# Patient Record
Sex: Female | Born: 2000
Health system: Southern US, Community
[De-identification: ages and names within clinical notes are randomized; demographics above are authoritative.]

## PROBLEM LIST (undated history)

## (undated) DIAGNOSIS — D580 Hereditary spherocytosis: Secondary | ICD-10-CM

## (undated) DIAGNOSIS — R51 Headache: Secondary | ICD-10-CM

## (undated) HISTORY — PX: ANKLE SURGERY: SHX546

## (undated) HISTORY — DX: Hereditary spherocytosis: D58.0

## (undated) HISTORY — DX: Headache: R51

---
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
## 2018-02-12 DIAGNOSIS — J0100 Acute maxillary sinusitis, unspecified: Secondary | ICD-10-CM | POA: Diagnosis not present

## 2018-02-27 DIAGNOSIS — J329 Chronic sinusitis, unspecified: Secondary | ICD-10-CM | POA: Diagnosis not present

## 2018-04-14 DIAGNOSIS — Z01419 Encounter for gynecological examination (general) (routine) without abnormal findings: Secondary | ICD-10-CM | POA: Diagnosis not present

## 2018-04-17 DIAGNOSIS — J309 Allergic rhinitis, unspecified: Secondary | ICD-10-CM | POA: Diagnosis not present

## 2018-04-17 DIAGNOSIS — Z91018 Allergy to other foods: Secondary | ICD-10-CM | POA: Diagnosis not present

## 2018-05-07 ENCOUNTER — Encounter (HOSPITAL_COMMUNITY): Payer: Self-pay | Admitting: Emergency Medicine

## 2018-05-07 ENCOUNTER — Other Ambulatory Visit: Payer: Self-pay

## 2018-05-07 ENCOUNTER — Ambulatory Visit (HOSPITAL_COMMUNITY)
Admission: EM | Admit: 2018-05-07 | Discharge: 2018-05-07 | Disposition: A | Discharge status: Home / Self Care | Payer: BLUE CROSS/BLUE SHIELD | Attending: Family Medicine | Admitting: Family Medicine

## 2018-05-07 DIAGNOSIS — H1033 Unspecified acute conjunctivitis, bilateral: Secondary | ICD-10-CM | POA: Diagnosis not present

## 2018-05-07 MED ORDER — SULFACETAMIDE SODIUM 10 % OP SOLN: 1.0000 [drp] | Freq: Four times a day (QID) | OPHTHALMIC | 0 refills | Status: DC

## 2018-05-07 NOTE — Discharge Instructions (Addendum)
Use the eye drops as scheduled Expect improvement in a few days Be careful of contagiousness.

## 2018-05-07 NOTE — ED Provider Notes (Signed)
[facility]    CSN: [id number] Arrival date & time: 05/07/18  0802     History   Chief Complaint Chief Complaint  Patient presents with  . Eye Problem    HPI Frances Bowman is a 18 y.o. female.   HPI Patient started with irritation and redness in her right swelling yesterday.  This morning it was crusted shut.  Lid is swollen.  Very irritated.  No foreign body.  No recent illness.  She did have a sore throat a couple days ago.  No exposure to pinkeye.  This morning when she woke up the left eye was slightly red as well.  No photophobia.  No tearing.  She does have scant yellow discharge. History reviewed. No pertinent past medical history.  There are no active problems to display for this patient.   Past Surgical History:  Procedure Laterality Date  . ANKLE SURGERY Left     OB History   No obstetric history on file.      Home Medications    Prior to Admission medications   Medication Sig Start Date End Date Taking? Authorizing Provider  sulfacetamide (BLEPH-10) 10 % ophthalmic solution Place 1-2 drops into both eyes 4 (four) times daily. 05/07/18   Eustace Moore, MD    Family History Family History  Problem Relation Age of Onset  . Hypertension Father     Social History Social History   Tobacco Use  . Smoking status: Never Smoker  . Smokeless tobacco: Never Used  Substance Use Topics  . Alcohol use: Yes  . Drug use: Never     Allergies   Patient has no known allergies.   Review of Systems Review of Systems  Constitutional: Negative for chills and fever.  HENT: Negative for ear pain and sore throat.   Eyes: Positive for discharge, redness and itching. Negative for photophobia, pain and visual disturbance.  Respiratory: Negative for cough and shortness of breath.   Cardiovascular: Negative for chest pain and palpitations.  Gastrointestinal: Negative for abdominal pain and vomiting.  Genitourinary: Negative for dysuria and  hematuria.  Musculoskeletal: Negative for arthralgias and back pain.  Skin: Negative for color change and rash.  Neurological: Negative for seizures and syncope.  All other systems reviewed and are negative.    Physical Exam Triage Vital Signs ED Triage Vitals  Enc Vitals Group     BP 05/07/18 0824 111/71     Pulse Rate 05/07/18 0824 71     Resp 05/07/18 0824 16     Temp 05/07/18 0824 97.7 F (36.5 C)     Temp Source 05/07/18 0824 Oral     SpO2 05/07/18 0824 100 %     Weight --      Height --      Head Circumference --      Peak Flow --      Pain Score 05/07/18 0838 2     Pain Loc --      Pain Edu? --      Excl. in GC? --    No data found.  Updated Vital Signs BP 111/71   Pulse 71   Temp 97.7 F (36.5 C) (Oral)   Resp 16   LMP 04/01/2018   SpO2 100%   Visual Acuity Right Eye Distance: 20/40 Left Eye Distance: 20/50 Bilateral Distance: 20/30(performed without glasses)  Right Eye Near:   Left Eye Near:    Bilateral Near:     Physical Exam Constitutional:  General: She is not in acute distress.    Appearance: She is well-developed.  HENT:     Head: Normocephalic and atraumatic.  Eyes:     Pupils: Pupils are equal, round, and reactive to light.     Comments: Right eye has soft tissue swelling of the upper lid.  Moderate conjunctival injection.  Yellow crusting at the medial canthus.  Left eye has moderate injection.  Neck:     Musculoskeletal: Normal range of motion.  Cardiovascular:     Rate and Rhythm: Normal rate.  Pulmonary:     Effort: Pulmonary effort is normal. No respiratory distress.  Abdominal:     General: There is no distension.     Palpations: Abdomen is soft.  Musculoskeletal: Normal range of motion.  Lymphadenopathy:     Cervical: No cervical adenopathy.  Skin:    General: Skin is warm and dry.  Neurological:     Mental Status: She is alert.      UC Treatments / Results  Labs (all labs ordered are listed, but only abnormal  results are displayed) Labs Reviewed - No data to display  EKG None  Radiology No results found.  Procedures Procedures (including critical care time)  Medications Ordered in UC Medications - No data to display  Initial Impression / Assessment and Plan / UC Course  I have reviewed the triage vital signs and the nursing notes.  Pertinent labs & imaging results that were available during my care of the patient were reviewed by me and considered in my medical decision making (see chart for details).     Discussed differential diagnosis of pinkeye, allergic, irritant, viral and bacterial.  I think this is bacterial.  We will going to treat her with an eyedrop, compresses, return if not better in a few days.  She also was advised regarding contagiousness, handwashing Final Clinical Impressions(s) / UC Diagnoses   Final diagnoses:  Acute bacterial conjunctivitis of both eyes     Discharge Instructions     Use the eye drops as scheduled Expect improvement in a few days Be careful of contagiousness.   ED Prescriptions    Medication Sig Dispense Auth. Provider   sulfacetamide (BLEPH-10) 10 % ophthalmic solution Place 1-2 drops into both eyes 4 (four) times daily. 5 mL Eustace Moore, MD     Controlled Substance Prescriptions [place] Controlled Substance Registry consulted? Not Applicable   Eustace Moore, MD 05/07/18 1023

## 2018-05-07 NOTE — ED Triage Notes (Signed)
Woke with right eyelid swelling today.  Reports right eye was red yesterday.  Left is red too.  No blurriness.  Reports drainage this morning.    Patient wears glasses-never wears contacts   2-3 days prior to this, did have a sore throat.  No sore throat now

## 2018-05-13 ENCOUNTER — Ambulatory Visit (HOSPITAL_COMMUNITY)
Admission: EM | Admit: 2018-05-13 | Discharge: 2018-05-13 | Disposition: A | Discharge status: Home / Self Care | Payer: BLUE CROSS/BLUE SHIELD

## 2018-05-13 ENCOUNTER — Encounter (HOSPITAL_COMMUNITY): Payer: Self-pay | Admitting: Emergency Medicine

## 2018-05-13 ENCOUNTER — Other Ambulatory Visit: Payer: Self-pay

## 2018-05-13 DIAGNOSIS — B349 Viral infection, unspecified: Secondary | ICD-10-CM | POA: Diagnosis not present

## 2018-05-13 DIAGNOSIS — H66002 Acute suppurative otitis media without spontaneous rupture of ear drum, left ear: Secondary | ICD-10-CM

## 2018-05-13 MED ORDER — FLUTICASONE PROPIONATE 50 MCG/ACT NA SUSP: 2.0000 | Freq: Every day | NASAL | 0 refills | Status: DC

## 2018-05-13 MED ORDER — IPRATROPIUM BROMIDE 0.06 % NA SOLN: 2.0000 | Freq: Four times a day (QID) | NASAL | 0 refills | Status: DC

## 2018-05-13 NOTE — Discharge Instructions (Signed)
Start flonase, atrovent nasal spray for nasal congestion/drainage. You can use over the counter nasal saline rinse such as neti pot for nasal congestion. Keep hydrated, your urine should be clear to pale yellow in color. Tylenol/motrin for fever and pain. Monitor for any worsening of symptoms, chest pain, shortness of breath, wheezing, swelling of the throat, follow up for reevaluation.   As discussed, can use over the counter afrin for the first 2-3 days as needed.

## 2018-05-13 NOTE — ED Triage Notes (Signed)
Seen 1/30 for pink eye.  Today patient has congestion, coughing, ears hurting, no sense of smell, no taste .

## 2018-05-13 NOTE — ED Provider Notes (Signed)
[facility]    CSN: [id number] Arrival date & time: 05/13/18  0803     History   Chief Complaint Chief Complaint  Patient presents with  . URI    HPI Frances Bowman is a 18 y.o. female.   18 year old female comes in for 5-day history of URI symptoms.  She was seen for conjunctivitis on 05/07/2018, states symptoms resolved prior to current symptom onset.  She has had congestion, cough, sneezing, bilateral ear pain, decreased sense of smell and taste.  Denies fever, chills, night sweats.  Has been using Sudafed and Zyrtec with good relief of rhinorrhea.  Never smoker.  Positive sick contact.     History reviewed. No pertinent past medical history.  There are no active problems to display for this patient.   Past Surgical History:  Procedure Laterality Date  . ANKLE SURGERY Left     OB History   No obstetric history on file.      Home Medications    Prior to Admission medications   Medication Sig Start Date End Date Taking? Authorizing Provider  cetirizine (ZYRTEC) 10 MG chewable tablet Chew 10 mg by mouth daily.   Yes [provider]  pseudoephedrine (SUDAFED) 30 MG tablet Take 30 mg by mouth every 4 (four) hours as needed for congestion.   Yes [provider]  sulfacetamide (BLEPH-10) 10 % ophthalmic solution Place 1-2 drops into both eyes 4 (four) times daily. 05/07/18  Yes Eustace MooreNelson, Yvonne Sue, MD  fluticasone [facility](FLONASE) 50 MCG/ACT nasal spray Place 2 sprays into both nostrils daily. 05/13/18   Cathie HoopsYu, Deeksha Cotrell V, PA-C  ipratropium (ATROVENT) 0.06 % nasal spray Place 2 sprays into both nostrils 4 (four) times daily. 05/13/18   Belinda FisherYu, Kobe Jansma V, PA-C    Family History Family History  Problem Relation Age of Onset  . Hypertension Father     Social History Social History   Tobacco Use  . Smoking status: Never Smoker  . Smokeless tobacco: Never Used  Substance Use Topics  . Alcohol use: Yes  . Drug use: Never     Allergies   Patient has no known  allergies.   Review of Systems Review of Systems  Reason unable to perform ROS: See HPI as above.     Physical Exam Triage Vital Signs ED Triage Vitals  Enc Vitals Group     BP 05/13/18 0829 115/77     Pulse Rate 05/13/18 0829 (!) 109     Resp --      Temp 05/13/18 0829 98.6 F (37 C)     Temp Source 05/13/18 0829 Oral     SpO2 05/13/18 0829 97 %     Weight --      Height --      Head Circumference --      Peak Flow --      Pain Score 05/13/18 0837 4     Pain Loc --      Pain Edu? --      Excl. in GC? --    No data found.  Updated Vital Signs BP 115/77 (BP Location: Left Arm)   Pulse (!) 109 Comment: Reported HR to PA GambiaBrittany Wurst and Nurse Katy Pelky  Temp 98.6 F (37 C) (Oral)   LMP 03/12/2018   SpO2 97%   Physical Exam Constitutional:      General: She is not in acute distress.    Appearance: She is well-developed. She is not ill-appearing, toxic-appearing or diaphoretic.  HENT:  Head: Normocephalic and atraumatic.     Right Ear: Tympanic membrane, ear canal and external ear normal. Tympanic membrane is not erythematous or bulging.     Left Ear: Ear canal and external ear normal. Tympanic membrane is erythematous. Tympanic membrane is not bulging.     Nose: Congestion present.     Right Sinus: No maxillary sinus tenderness or frontal sinus tenderness.     Left Sinus: No maxillary sinus tenderness or frontal sinus tenderness.     Mouth/Throat:     Pharynx: Uvula midline.  Eyes:     Conjunctiva/sclera: Conjunctivae normal.     Pupils: Pupils are equal, round, and reactive to light.  Neck:     Musculoskeletal: Normal range of motion and neck supple.  Cardiovascular:     Rate and Rhythm: Normal rate and regular rhythm.     Heart sounds: Normal heart sounds. No murmur. No friction rub. No gallop.   Pulmonary:     Effort: Pulmonary effort is normal.     Breath sounds: Normal breath sounds. No decreased breath sounds, wheezing, rhonchi or rales.    Lymphadenopathy:     Cervical: No cervical adenopathy.  Skin:    General: Skin is warm and dry.  Neurological:     Mental Status: She is alert and oriented to person, place, and time.  Psychiatric:        Behavior: Behavior normal.        Judgment: Judgment normal.      UC Treatments / Results  Labs (all labs ordered are listed, but only abnormal results are displayed) Labs Reviewed - No data to display  EKG None  Radiology No results found.  Procedures Procedures (including critical care time)  Medications Ordered in UC Medications - No data to display  Initial Impression / Assessment and Plan / UC Course  I have reviewed the triage vital signs and the nursing notes.  Pertinent labs & imaging results that were available during my care of the patient were reviewed by me and considered in my medical decision making (see chart for details).    Patient with left otitis media, discussed symptomatic treatment versus antibiotics.  Patient would like to avoid antibiotics at this time.  Symptomatic treatment discussed.  Push fluids.  Return precautions given.  Patient expresses understanding and agrees plan.  Final Clinical Impressions(s) / UC Diagnoses   Final diagnoses:  Viral illness  Non-recurrent acute suppurative otitis media of left ear without spontaneous rupture of tympanic membrane    ED Prescriptions    Medication Sig Dispense Auth. Provider   fluticasone (FLONASE) 50 MCG/ACT nasal spray Place 2 sprays into both nostrils daily. 1 g Renata Gambino V, PA-C   ipratropium (ATROVENT) 0.06 % nasal spray Place 2 sprays into both nostrils 4 (four) times daily. 15 mL Threasa Alpha, [place] 05/13/18 [phone]

## 2018-05-15 DIAGNOSIS — Z3202 Encounter for pregnancy test, result negative: Secondary | ICD-10-CM | POA: Diagnosis not present

## 2018-05-15 DIAGNOSIS — Z30430 Encounter for insertion of intrauterine contraceptive device: Secondary | ICD-10-CM | POA: Diagnosis not present

## 2018-06-15 DIAGNOSIS — R5383 Other fatigue: Secondary | ICD-10-CM | POA: Diagnosis not present

## 2018-06-15 DIAGNOSIS — D580 Hereditary spherocytosis: Secondary | ICD-10-CM | POA: Diagnosis not present

## 2018-06-15 DIAGNOSIS — N281 Cyst of kidney, acquired: Secondary | ICD-10-CM | POA: Diagnosis not present

## 2018-06-26 DIAGNOSIS — Z30431 Encounter for routine checking of intrauterine contraceptive device: Secondary | ICD-10-CM | POA: Diagnosis not present

## 2018-12-16 DIAGNOSIS — M9905 Segmental and somatic dysfunction of pelvic region: Secondary | ICD-10-CM | POA: Diagnosis not present

## 2018-12-16 DIAGNOSIS — M791 Myalgia, unspecified site: Secondary | ICD-10-CM | POA: Diagnosis not present

## 2018-12-16 DIAGNOSIS — M9902 Segmental and somatic dysfunction of thoracic region: Secondary | ICD-10-CM | POA: Diagnosis not present

## 2018-12-16 DIAGNOSIS — M9903 Segmental and somatic dysfunction of lumbar region: Secondary | ICD-10-CM | POA: Diagnosis not present

## 2018-12-21 DIAGNOSIS — M9905 Segmental and somatic dysfunction of pelvic region: Secondary | ICD-10-CM | POA: Diagnosis not present

## 2018-12-21 DIAGNOSIS — M791 Myalgia, unspecified site: Secondary | ICD-10-CM | POA: Diagnosis not present

## 2018-12-21 DIAGNOSIS — M9902 Segmental and somatic dysfunction of thoracic region: Secondary | ICD-10-CM | POA: Diagnosis not present

## 2018-12-21 DIAGNOSIS — M9903 Segmental and somatic dysfunction of lumbar region: Secondary | ICD-10-CM | POA: Diagnosis not present

## 2018-12-23 DIAGNOSIS — M9903 Segmental and somatic dysfunction of lumbar region: Secondary | ICD-10-CM | POA: Diagnosis not present

## 2018-12-23 DIAGNOSIS — M9902 Segmental and somatic dysfunction of thoracic region: Secondary | ICD-10-CM | POA: Diagnosis not present

## 2018-12-23 DIAGNOSIS — M9905 Segmental and somatic dysfunction of pelvic region: Secondary | ICD-10-CM | POA: Diagnosis not present

## 2018-12-23 DIAGNOSIS — M791 Myalgia, unspecified site: Secondary | ICD-10-CM | POA: Diagnosis not present

## 2018-12-28 DIAGNOSIS — M9902 Segmental and somatic dysfunction of thoracic region: Secondary | ICD-10-CM | POA: Diagnosis not present

## 2018-12-28 DIAGNOSIS — M791 Myalgia, unspecified site: Secondary | ICD-10-CM | POA: Diagnosis not present

## 2018-12-28 DIAGNOSIS — M9903 Segmental and somatic dysfunction of lumbar region: Secondary | ICD-10-CM | POA: Diagnosis not present

## 2018-12-28 DIAGNOSIS — M9905 Segmental and somatic dysfunction of pelvic region: Secondary | ICD-10-CM | POA: Diagnosis not present

## 2018-12-30 DIAGNOSIS — M9902 Segmental and somatic dysfunction of thoracic region: Secondary | ICD-10-CM | POA: Diagnosis not present

## 2018-12-30 DIAGNOSIS — M9903 Segmental and somatic dysfunction of lumbar region: Secondary | ICD-10-CM | POA: Diagnosis not present

## 2018-12-30 DIAGNOSIS — M9905 Segmental and somatic dysfunction of pelvic region: Secondary | ICD-10-CM | POA: Diagnosis not present

## 2018-12-30 DIAGNOSIS — M791 Myalgia, unspecified site: Secondary | ICD-10-CM | POA: Diagnosis not present

## 2019-01-04 DIAGNOSIS — M9903 Segmental and somatic dysfunction of lumbar region: Secondary | ICD-10-CM | POA: Diagnosis not present

## 2019-01-04 DIAGNOSIS — M9901 Segmental and somatic dysfunction of cervical region: Secondary | ICD-10-CM | POA: Diagnosis not present

## 2019-01-04 DIAGNOSIS — M9902 Segmental and somatic dysfunction of thoracic region: Secondary | ICD-10-CM | POA: Diagnosis not present

## 2019-01-11 ENCOUNTER — Other Ambulatory Visit: Payer: Self-pay

## 2019-01-11 ENCOUNTER — Encounter: Payer: Self-pay | Admitting: Podiatry

## 2019-01-11 ENCOUNTER — Ambulatory Visit (INDEPENDENT_AMBULATORY_CARE_PROVIDER_SITE_OTHER): Payer: BC Managed Care – PPO

## 2019-01-11 ENCOUNTER — Ambulatory Visit (INDEPENDENT_AMBULATORY_CARE_PROVIDER_SITE_OTHER): Payer: BC Managed Care – PPO | Admitting: Podiatry

## 2019-01-11 DIAGNOSIS — M25572 Pain in left ankle and joints of left foot: Secondary | ICD-10-CM

## 2019-01-11 DIAGNOSIS — M899 Disorder of bone, unspecified: Secondary | ICD-10-CM | POA: Diagnosis not present

## 2019-01-11 DIAGNOSIS — G8929 Other chronic pain: Secondary | ICD-10-CM | POA: Diagnosis not present

## 2019-01-11 DIAGNOSIS — M949 Disorder of cartilage, unspecified: Secondary | ICD-10-CM

## 2019-01-11 NOTE — Patient Instructions (Signed)
Pre-Operative Instructions  Congratulations, you have decided to take an important step towards improving your quality of life.  You can be assured that the doctors and staff at [facility] will be with you every step of the way.  Here are some important things you should know:  1. Plan to be at the surgery center/hospital at least 1 (one) hour prior to your scheduled time, unless otherwise directed by the surgical center/hospital staff.  You must have a responsible adult accompany you, remain during the surgery and drive you home.  Make sure you have directions to the surgical center/hospital to ensure you arrive on time. 2. If you are having surgery at [facility] or [facility] hospitals, you will need a copy of your medical history and physical form from your family physician within one month prior to the date of surgery. We will give you a form for your primary physician to complete.  3. We make every effort to accommodate the date you request for surgery.  However, there are times where surgery dates or times have to be moved.  We will contact you as soon as possible if a change in schedule is required.   4. No aspirin/ibuprofen for one week before surgery.  If you are on aspirin, any non-steroidal anti-inflammatory medications (Mobic, Aleve, Ibuprofen) should not be taken seven (7) days prior to your surgery.  You make take Tylenol for pain prior to surgery.  5. Medications - If you are taking daily heart and blood pressure medications, seizure, reflux, allergy, asthma, anxiety, pain or diabetes medications, make sure you notify the surgery center/hospital before the day of surgery so they can tell you which medications you should take or avoid the day of surgery. 6. No food or drink after midnight the night before surgery unless directed otherwise by surgical center/hospital staff. 7. No alcoholic beverages 24-hours prior to surgery.  No smoking 24-hours prior or 24-hours after  surgery. 8. Wear loose pants or shorts. They should be loose enough to fit over bandages, boots, and casts. 9. Don't wear slip-on shoes. Sneakers are preferred. 10. Bring your boot with you to the surgery center/hospital.  Also bring crutches or a walker if your physician has prescribed it for you.  If you do not have this equipment, it will be provided for you after surgery. 11. If you have not been contacted by the surgery center/hospital by the day before your surgery, call to confirm the date and time of your surgery. 12. Leave-time from work may vary depending on the type of surgery you have.  Appropriate arrangements should be made prior to surgery with your employer. 13. Prescriptions will be provided immediately following surgery by your doctor.  Fill these as soon as possible after surgery and take the medication as directed. Pain medications will not be refilled on weekends and must be approved by the doctor. 14. Remove nail polish on the operative foot and avoid getting pedicures prior to surgery. 15. Wash the night before surgery.  The night before surgery wash the foot and leg well with water and the antibacterial soap provided. Be sure to pay special attention to beneath the toenails and in between the toes.  Wash for at least three (3) minutes. Rinse thoroughly with water and dry well with a towel.  Perform this wash unless told not to do so by your physician.  Enclosed: 1 Ice pack (please put in freezer the night before surgery)   1 Hibiclens skin cleaner     Pre-op instructions  If you have any questions regarding the instructions, please do not hesitate to call our office.  [place]: [address], [place], [place] [address] -- [phone]  [place]: [address] [place], [place] [address] -- [phone]  [place]: [address]  [place], [place] [address] -- [phone]   Website: [email or web address] 

## 2019-01-13 DIAGNOSIS — M9901 Segmental and somatic dysfunction of cervical region: Secondary | ICD-10-CM | POA: Diagnosis not present

## 2019-01-13 DIAGNOSIS — M9902 Segmental and somatic dysfunction of thoracic region: Secondary | ICD-10-CM | POA: Diagnosis not present

## 2019-01-13 DIAGNOSIS — M9903 Segmental and somatic dysfunction of lumbar region: Secondary | ICD-10-CM | POA: Diagnosis not present

## 2019-01-20 DIAGNOSIS — M9901 Segmental and somatic dysfunction of cervical region: Secondary | ICD-10-CM | POA: Diagnosis not present

## 2019-01-20 DIAGNOSIS — M9902 Segmental and somatic dysfunction of thoracic region: Secondary | ICD-10-CM | POA: Diagnosis not present

## 2019-01-20 DIAGNOSIS — M9903 Segmental and somatic dysfunction of lumbar region: Secondary | ICD-10-CM | POA: Diagnosis not present

## 2019-01-22 ENCOUNTER — Telehealth: Payer: Self-pay | Admitting: *Deleted

## 2019-01-22 NOTE — Telephone Encounter (Signed)
DOS 01/27/2019 REPAIR OSTEOCHONDRAL LESION ANKLE - 28102, TIBIAL OSTEOTOMY - 41660  BCBS: Eligibility Date - 04/08/2018 - 04/07/9998   In-Network    Max Per Benefit Period Year-to-Date Remaining  CoInsurance     Deductible  $2,800.00 $6,301.60  Out-Of-Pocket 3  $3,000.00 $1,093.23   In-Network  [employer] Coinsurance Authorization Required  Not Applicable  55%  NO - per Jenny Reichmann ref. # [id number]  Messages:

## 2019-01-26 NOTE — Progress Notes (Addendum)
Subjective: 18 year old female presents the office today for follow-up evaluation of continued pain to the left ankle and she said the pain has been getting worse.  She wants to go ahead and proceed with further surgery in order to help with her pain.  She states that she not able to do her daily activities because of discomfort to her ankle.  She states after initial arthroscopy she did get some short-term relief of the pain is worsened. Denies any systemic complaints such as fevers, chills, nausea, vomiting. No acute changes since last appointment, and no other complaints at this time.   Objective: AAO x3, NAD DP/PT pulses palpable bilaterally, CRT less than 3 seconds There is tenderness palpation on the anterior aspect of the left ankle joint.  There is no crepitation with ankle joint range of motion.  No popping or clicking identified today.  No tenderness on the course of the flexor, extensor tendons.  No pain Achilles tendon.  No significant edema or erythema.  No pain with calf compression, swelling, warmth, erythema  Assessment: Osteochondral lesion left ankle  Plan: -All treatment options discussed with the patient including all alternatives, risks, complications.  -I reviewed the MRI with her as well as x-rays.  We discussed both conservative as well as surgical treatment options.  At this time she wished to proceed with surgical intervention.  We discussed open repair of the osteochondral lesion with possible medial malleolus osteotomy in order to access the surgical site.  We discussed repair with likely Denovo cartilage as well as screw fixation for the medial malleolus. -The incision placement as well as the postoperative course was discussed with the patient. I discussed risks of the surgery which include, but not limited to, infection, bleeding, pain, swelling, need for further surgery, delayed or nonhealing, painful or ugly scar, numbness or sensation changes, over/under correction,  recurrence, transfer lesions, further deformity, hardware failure, DVT/PE, loss of toe/foot. Patient understands these risks and wishes to proceed with surgery. The surgical consent was reviewed with the patient all 3 pages were signed. No promises or guarantees were given to the outcome of the procedure. All questions were answered to the best of my ability. Before the surgery the patient was encouraged to call the office if there is any further questions. The surgery will be performed at the [facility] on an outpatient basis. -CAM boot dispensed for postop use.   Trula Slade DPM  *patient requested a knee scooter for postop use. Expected duration of use is 3 months and this is due to NWB s/p open OCD repair of the talus and medial malleolus osteotomy.

## 2019-01-27 ENCOUNTER — Encounter: Payer: Self-pay | Admitting: Podiatry

## 2019-01-27 ENCOUNTER — Telehealth: Payer: Self-pay | Admitting: *Deleted

## 2019-01-27 ENCOUNTER — Other Ambulatory Visit: Payer: Self-pay | Admitting: Podiatry

## 2019-01-27 DIAGNOSIS — Z9889 Other specified postprocedural states: Secondary | ICD-10-CM

## 2019-01-27 DIAGNOSIS — M25572 Pain in left ankle and joints of left foot: Secondary | ICD-10-CM | POA: Diagnosis not present

## 2019-01-27 DIAGNOSIS — M899 Disorder of bone, unspecified: Secondary | ICD-10-CM

## 2019-01-27 DIAGNOSIS — M958 Other specified acquired deformities of musculoskeletal system: Secondary | ICD-10-CM | POA: Diagnosis not present

## 2019-01-27 DIAGNOSIS — Z01818 Encounter for other preprocedural examination: Secondary | ICD-10-CM | POA: Diagnosis not present

## 2019-01-27 DIAGNOSIS — M93279 Osteochondritis dissecans, unspecified ankle and joints of foot: Secondary | ICD-10-CM

## 2019-01-27 DIAGNOSIS — M948X7 Other specified disorders of cartilage, ankle and foot: Secondary | ICD-10-CM | POA: Diagnosis not present

## 2019-01-27 DIAGNOSIS — M93272 Osteochondritis dissecans, left ankle and joints of left foot: Secondary | ICD-10-CM | POA: Diagnosis not present

## 2019-01-27 MED ORDER — OXYCODONE-ACETAMINOPHEN 5-325 MG PO TABS: 1.0000 | ORAL_TABLET | Freq: Four times a day (QID) | ORAL | 0 refills | Status: DC | PRN

## 2019-01-27 MED ORDER — PROMETHAZINE HCL 25 MG PO TABS: 25.0000 mg | ORAL_TABLET | Freq: Three times a day (TID) | ORAL | 0 refills | Status: DC | PRN

## 2019-01-27 MED ORDER — CEPHALEXIN 500 MG PO CAPS: 500.0000 mg | ORAL_CAPSULE | Freq: Three times a day (TID) | ORAL | 0 refills | Status: DC

## 2019-01-27 NOTE — Telephone Encounter (Signed)
Faxed and emailed order to [place].

## 2019-01-27 NOTE — Progress Notes (Signed)
Postop medications sent to pharmacy  Reviewed the surgery and postop course with the patient and her father. No further questions. Consent signed. Will plan to proceed as scheduled.   Will order a knee scooter.   Trula Slade

## 2019-01-27 NOTE — Telephone Encounter (Signed)
-----   Message from Trula Slade, DPM sent at 01/27/2019  7:16 AM EDT ----- Patient requested a knee scooter this morning. Can you please order one? Thanks.

## 2019-01-29 ENCOUNTER — Telehealth: Payer: Self-pay | Admitting: *Deleted

## 2019-01-29 NOTE — Telephone Encounter (Signed)
Called and talked to the patient and I stated that I was calling to check on patient after having surgery on Wednesday January 27, 2019 with Dr Jacqualyn Posey and patient stated that she was better today and there was no fever or chills and not any nausea and the numbness has wore off and there is some swelling and I have elevated and iced and I stated to call the [place] office if any concerns or questions. Lattie Haw

## 2019-02-01 DIAGNOSIS — L819 Disorder of pigmentation, unspecified: Secondary | ICD-10-CM | POA: Diagnosis not present

## 2019-02-01 DIAGNOSIS — L700 Acne vulgaris: Secondary | ICD-10-CM | POA: Diagnosis not present

## 2019-02-01 DIAGNOSIS — D225 Melanocytic nevi of trunk: Secondary | ICD-10-CM | POA: Diagnosis not present

## 2019-02-04 ENCOUNTER — Encounter: Payer: BC Managed Care – PPO | Admitting: Podiatry

## 2019-02-05 ENCOUNTER — Ambulatory Visit (INDEPENDENT_AMBULATORY_CARE_PROVIDER_SITE_OTHER): Payer: BC Managed Care – PPO | Admitting: Podiatry

## 2019-02-05 ENCOUNTER — Ambulatory Visit (INDEPENDENT_AMBULATORY_CARE_PROVIDER_SITE_OTHER): Payer: BC Managed Care – PPO

## 2019-02-05 ENCOUNTER — Other Ambulatory Visit: Payer: Self-pay | Admitting: Podiatry

## 2019-02-05 ENCOUNTER — Other Ambulatory Visit: Payer: Self-pay

## 2019-02-05 DIAGNOSIS — M949 Disorder of cartilage, unspecified: Secondary | ICD-10-CM | POA: Diagnosis not present

## 2019-02-05 DIAGNOSIS — M899 Disorder of bone, unspecified: Secondary | ICD-10-CM

## 2019-02-05 DIAGNOSIS — M25572 Pain in left ankle and joints of left foot: Secondary | ICD-10-CM

## 2019-02-05 DIAGNOSIS — Z9889 Other specified postprocedural states: Secondary | ICD-10-CM

## 2019-02-05 DIAGNOSIS — G8929 Other chronic pain: Secondary | ICD-10-CM

## 2019-02-05 NOTE — Progress Notes (Signed)
Subjective: Frances Bowman is a 18 y.o. is seen today in office s/p left ankle osteochondral repair of the talus utilizing Denovo cartilage of medial malleolus osteotomy preformed on 01/27/2019.  She states her pain is controlled and she states is not as bad as what she thought it was going to be.  She been nonweightbearing using cam boot.  Denies any systemic complaints such as fevers, chills, nausea, vomiting. No calf pain, chest pain, shortness of breath.   Objective: General: No acute distress, AAOx3  DP/PT pulses palpable 2/4, CRT < 3 sec to all digits.  Protective sensation intact. Motor function intact.  LEFT ankle: Incision is well coapted without any evidence of dehiscence and sutures, staples and. There is no surrounding erythema, ascending cellulitis, fluctuance, crepitus, malodor, drainage/purulence. There is mild edema around the surgical site. There is minimal pain along the surgical site.  No other areas of tenderness to bilateral lower extremities.  No other open lesions or pre-ulcerative lesions.  No pain with calf compression, swelling, warmth, erythema.   Assessment and Plan:  Status post left ankle surgery, doing well with no complications   -Treatment options discussed including all alternatives, risks, and complications -X-rays obtained reviewed.  Status post medial malleolus osteotomy with hardware intact. -Antibiotic ointment was applied to the incision followed by dry sterile dressing.  Keep dressing clean, dry, intact -Continue nonweightbearing. -Ice/elevation -Pain medication as needed. -Monitor for any clinical signs or symptoms of infection and DVT/PE and directed to call the office immediately should any occur or go to the ER. -Follow-up as scheduled or sooner if any problems arise. In the meantime, encouraged to call the office with any questions, concerns, change in symptoms.   Celesta Gentile, DPM

## 2019-02-06 ENCOUNTER — Other Ambulatory Visit: Payer: Self-pay | Admitting: Podiatry

## 2019-02-06 DIAGNOSIS — M949 Disorder of cartilage, unspecified: Secondary | ICD-10-CM

## 2019-02-06 DIAGNOSIS — M899 Disorder of bone, unspecified: Secondary | ICD-10-CM

## 2019-02-11 ENCOUNTER — Ambulatory Visit (INDEPENDENT_AMBULATORY_CARE_PROVIDER_SITE_OTHER): Payer: Self-pay | Admitting: Podiatry

## 2019-02-11 ENCOUNTER — Other Ambulatory Visit: Payer: Self-pay

## 2019-02-11 DIAGNOSIS — M949 Disorder of cartilage, unspecified: Secondary | ICD-10-CM

## 2019-02-11 DIAGNOSIS — Z9889 Other specified postprocedural states: Secondary | ICD-10-CM

## 2019-02-11 DIAGNOSIS — M899 Disorder of bone, unspecified: Secondary | ICD-10-CM

## 2019-02-17 NOTE — Progress Notes (Signed)
Subjective: Frances Bowman is a 18 y.o. is seen today in office s/p left ankle osteochondral repair of the talus utilizing Denovo cartilage of medial malleolus osteotomy preformed on 01/27/2019.  She states that she is doing well she is not having any pain.  She is been nonweightbearing wearing the cam boot. Denies any systemic complaints such as fevers, chills, nausea, vomiting. No calf pain, chest pain, shortness of breath.   Objective: General: No acute distress, AAOx3  DP/PT pulses palpable 2/4, CRT < 3 sec to all digits.  Protective sensation intact. Motor function intact.  LEFT ankle: Incision is well coapted without any evidence of dehiscence and sutures, staples and. There is no surrounding erythema, ascending cellulitis, fluctuance, crepitus, malodor, drainage/purulence. There is minimal edema around the surgical site. There is no pain along the surgical site.  No other areas of tenderness to bilateral lower extremities.  No other open lesions or pre-ulcerative lesions.  No pain with calf compression, swelling, warmth, erythema.   Assessment and Plan:  Status post left ankle surgery, doing well with no complications   -Treatment options discussed including all alternatives, risks, and complications -Today with the sutures that the staples intact.  Antibiotic ointment and a bandage was applied.  Keep dressing clean, dry, intact. -Continue nonweightbearing.  Wear cam boot at all times -Ice/elevation -Pain medication as needed-she has not been taking any pain medication. -Monitor for any clinical signs or symptoms of infection and DVT/PE and directed to call the office immediately should any occur or go to the ER. -Follow-up as scheduled or sooner if any problems arise. In the meantime, encouraged to call the office with any questions, concerns, change in symptoms.   Celesta Gentile, DPM

## 2019-02-22 DIAGNOSIS — M9901 Segmental and somatic dysfunction of cervical region: Secondary | ICD-10-CM | POA: Diagnosis not present

## 2019-02-22 DIAGNOSIS — M9903 Segmental and somatic dysfunction of lumbar region: Secondary | ICD-10-CM | POA: Diagnosis not present

## 2019-02-22 DIAGNOSIS — M9902 Segmental and somatic dysfunction of thoracic region: Secondary | ICD-10-CM | POA: Diagnosis not present

## 2019-02-25 ENCOUNTER — Other Ambulatory Visit: Payer: Self-pay

## 2019-02-25 ENCOUNTER — Ambulatory Visit (INDEPENDENT_AMBULATORY_CARE_PROVIDER_SITE_OTHER): Payer: BC Managed Care – PPO | Admitting: Podiatry

## 2019-02-25 ENCOUNTER — Ambulatory Visit (INDEPENDENT_AMBULATORY_CARE_PROVIDER_SITE_OTHER): Payer: BC Managed Care – PPO

## 2019-02-25 DIAGNOSIS — M899 Disorder of bone, unspecified: Secondary | ICD-10-CM

## 2019-02-25 DIAGNOSIS — M25572 Pain in left ankle and joints of left foot: Secondary | ICD-10-CM

## 2019-02-25 DIAGNOSIS — Z9889 Other specified postprocedural states: Secondary | ICD-10-CM

## 2019-02-25 DIAGNOSIS — M949 Disorder of cartilage, unspecified: Secondary | ICD-10-CM | POA: Diagnosis not present

## 2019-02-26 ENCOUNTER — Other Ambulatory Visit: Payer: Self-pay | Admitting: Podiatry

## 2019-02-26 DIAGNOSIS — M949 Disorder of cartilage, unspecified: Secondary | ICD-10-CM

## 2019-02-26 DIAGNOSIS — M899 Disorder of bone, unspecified: Secondary | ICD-10-CM

## 2019-03-03 DIAGNOSIS — M899 Disorder of bone, unspecified: Secondary | ICD-10-CM | POA: Insufficient documentation

## 2019-03-03 NOTE — Progress Notes (Signed)
Subjective: Frances Bowman is a 18 y.o. is seen today in office s/p left ankle osteochondral repair of the talus utilizing Denovo cartilage of medial malleolus osteotomy preformed on 01/27/2019.  She states that she is doing well she is not having any pain.  She is been nonweightbearing wearing the cam boot. Denies any systemic complaints such as fevers, chills, nausea, vomiting. No calf pain, chest pain, shortness of breath.  No new concerns.  Objective: General: No acute distress, AAOx3  DP/PT pulses palpable 2/4, CRT < 3 sec to all digits.  Protective sensation intact. Motor function intact.  LEFT ankle: Incision is well coapted without any evidence of dehiscence and staples are intact.  There is no surrounding erythema, ascending cellulitis, fluctuance, crepitus, malodor, drainage/purulence. There is minimal edema around the surgical site. There is no pain along the surgical site.  No other areas of tenderness to bilateral lower extremities.  No other open lesions or pre-ulcerative lesions.  No pain with calf compression, swelling, warmth, erythema.   Assessment and Plan:  Status post left ankle surgery, doing well with no complications   -Treatment options discussed including all alternatives, risks, and complications -X-ray taken reviewed.  Hardware intact.  There is radiolucency in the medial malleolus with mild gapping.  -Staples removed today without complications.  The incision remains well coapted.  She can start to get the incision wet and dry thoroughly and apply a small amount of antibiotic ointment and a bandage. -Given x-ray findings continue nonweightbearing. -Ice elevation -Monitor for any clinical signs or symptoms of infection and DVT/PE and directed to call the office immediately should any occur or go to the ER. -Follow-up as scheduled or sooner if any problems arise. In the meantime, encouraged to call the office with any questions, concerns, change in symptoms.    Return in about 2 weeks (around 03/11/2019).  Repeat ankle x-rays next appointment  Celesta Gentile, DPM

## 2019-03-11 ENCOUNTER — Ambulatory Visit (INDEPENDENT_AMBULATORY_CARE_PROVIDER_SITE_OTHER): Payer: BC Managed Care – PPO | Admitting: Podiatry

## 2019-03-11 ENCOUNTER — Ambulatory Visit (INDEPENDENT_AMBULATORY_CARE_PROVIDER_SITE_OTHER): Payer: BC Managed Care – PPO

## 2019-03-11 ENCOUNTER — Encounter: Payer: Self-pay | Admitting: Podiatry

## 2019-03-11 ENCOUNTER — Other Ambulatory Visit: Payer: Self-pay

## 2019-03-11 DIAGNOSIS — M899 Disorder of bone, unspecified: Secondary | ICD-10-CM | POA: Diagnosis not present

## 2019-03-11 DIAGNOSIS — M25572 Pain in left ankle and joints of left foot: Secondary | ICD-10-CM

## 2019-03-11 DIAGNOSIS — M949 Disorder of cartilage, unspecified: Secondary | ICD-10-CM

## 2019-03-11 NOTE — Progress Notes (Signed)
Subjective: Frances Bowman is a 18 y.o. is seen today in office s/p left ankle osteochondral repair of the talus utilizing Denovo cartilage of medial malleolus osteotomy preformed on 01/27/2019.  She states that she is doing much not having any pain.  She denies some bloody drainage although very minimal on the bandage but she changed it yesterday and this is more in the inferior portion the incision.  She denies any increase in swelling denies any redness or warmth.  She has no other concerns today.  She denies any fevers, chills, nausea, vomiting.  No calf pain, chest pain, shortness of breath.  Objective: General: No acute distress, AAOx3  DP/PT pulses palpable 2/4, CRT < 3 sec to all digits.  Protective sensation intact. Motor function intact.  LEFT ankle: Incision is well coapted without any evidence of dehiscence.  Small bloody drainage on the bandage as he is coming from the inferior portion of the incision but there is no definitive opening identified today.  There is no surrounding erythema, ascending cellulitis.  There is no fluctuance crepitation.  Minimal tenderness to palpation along the incision mostly in the inferior portion.  No pain at the malleoli.  No other areas of tenderness to bilateral lower extremities.  No other open lesions or pre-ulcerative lesions.  No pain with calf compression, swelling, warmth, erythema.   Assessment and Plan:  Status post left ankle surgery  -Treatment options discussed including all alternatives, risks, and complications -X-rays obtained reviewed.  Hardware appears to be intact and unchanged in position.  Still radiolucent line along the osteotomy site of the medial malleolus. -No open the incision today but I want to keep a small amount of antibiotic ointment and a dressing daily.  This was applied today.-Remain in the cam boot we discussed starting partial weightbearing in the boot.  We discussed that on x-ray the osteotomy has not healed and she  is to do this very gradually and there is any increasing pain or swelling she is to return to nonweightbearing.  She understood this.  On her continue ice elevation.  She is not taking any pain medicine at this time.  Return in about 2 weeks (around 03/25/2019).  Repeat x-rays of the ankle  Trula Slade DPM

## 2019-03-22 DIAGNOSIS — M9903 Segmental and somatic dysfunction of lumbar region: Secondary | ICD-10-CM | POA: Diagnosis not present

## 2019-03-22 DIAGNOSIS — M9901 Segmental and somatic dysfunction of cervical region: Secondary | ICD-10-CM | POA: Diagnosis not present

## 2019-03-22 DIAGNOSIS — M9902 Segmental and somatic dysfunction of thoracic region: Secondary | ICD-10-CM | POA: Diagnosis not present

## 2019-03-25 ENCOUNTER — Ambulatory Visit (INDEPENDENT_AMBULATORY_CARE_PROVIDER_SITE_OTHER): Payer: BC Managed Care – PPO

## 2019-03-25 ENCOUNTER — Ambulatory Visit (INDEPENDENT_AMBULATORY_CARE_PROVIDER_SITE_OTHER): Payer: BC Managed Care – PPO | Admitting: Podiatry

## 2019-03-25 ENCOUNTER — Other Ambulatory Visit: Payer: Self-pay

## 2019-03-25 DIAGNOSIS — M899 Disorder of bone, unspecified: Secondary | ICD-10-CM

## 2019-03-25 DIAGNOSIS — M949 Disorder of cartilage, unspecified: Secondary | ICD-10-CM

## 2019-03-28 NOTE — Progress Notes (Signed)
Subjective: Frances Bowman is a 18 y.o. is seen today in office s/p left ankle osteochondral repair of the talus utilizing Denovo cartilage of medial malleolus osteotomy preformed on 01/27/2019. She states that she is doing well she is having no pain.  She has been walking in the cam boot.  No increase in swelling or redness.  She is able to put her ankle range of motion without any discomfort.  The area that was bleeding previously resolved.  No new concerns. She denies any fevers, chills, nausea, vomiting.  No calf pain, chest pain, shortness of breath.  Objective: General: No acute distress, AAOx3  DP/PT pulses palpable 2/4, CRT < 3 sec to all digits.  Protective sensation intact. Motor function intact.  LEFT ankle: Incision is well coapted without any evidence of dehiscence.  Today there is no drainage or purulence identified.  There is no erythema or warmth.  Incision appears to be healed.  There is no tenderness along the surgical site and there is no pain with ankle range of motion there is no restriction with ankle range of motion. No other areas of tenderness to bilateral lower extremities.  No other open lesions or pre-ulcerative lesions.  No pain with calf compression, swelling, warmth, erythema.   Assessment and Plan:  Status post left ankle surgery  -Treatment options discussed including all alternatives, risks, and complications -X-rays obtained reviewed.  Hardware appears to be intact and unchanged in position.  Still radiolucent line along the osteotomy site of the medial malleolus however does appear to be increased consolidation compared to prior x-ray. -Incision appears to be healing well.  On her keep a small amount of moisturizing the incision daily.  Gentle range of motion exercises remain in cam boot at all times.  Ice elevation.  RTC as scheduled; repeat x-rays of the ankle, 3 views and possible referral to physical therapy pending x-rays  Trula Slade DPM

## 2019-04-05 DIAGNOSIS — M9901 Segmental and somatic dysfunction of cervical region: Secondary | ICD-10-CM | POA: Diagnosis not present

## 2019-04-05 DIAGNOSIS — M9902 Segmental and somatic dysfunction of thoracic region: Secondary | ICD-10-CM | POA: Diagnosis not present

## 2019-04-05 DIAGNOSIS — M9903 Segmental and somatic dysfunction of lumbar region: Secondary | ICD-10-CM | POA: Diagnosis not present

## 2019-04-12 DIAGNOSIS — Z91018 Allergy to other foods: Secondary | ICD-10-CM | POA: Diagnosis not present

## 2019-04-12 DIAGNOSIS — J309 Allergic rhinitis, unspecified: Secondary | ICD-10-CM | POA: Diagnosis not present

## 2019-04-15 ENCOUNTER — Other Ambulatory Visit: Payer: Self-pay

## 2019-04-15 ENCOUNTER — Ambulatory Visit (INDEPENDENT_AMBULATORY_CARE_PROVIDER_SITE_OTHER): Payer: BC Managed Care – PPO

## 2019-04-15 ENCOUNTER — Ambulatory Visit (INDEPENDENT_AMBULATORY_CARE_PROVIDER_SITE_OTHER): Payer: BC Managed Care – PPO | Admitting: Podiatry

## 2019-04-15 DIAGNOSIS — M949 Disorder of cartilage, unspecified: Secondary | ICD-10-CM

## 2019-04-15 DIAGNOSIS — S8255XK Nondisplaced fracture of medial malleolus of left tibia, subsequent encounter for closed fracture with nonunion: Secondary | ICD-10-CM

## 2019-04-15 DIAGNOSIS — M722 Plantar fascial fibromatosis: Secondary | ICD-10-CM

## 2019-04-15 DIAGNOSIS — M899 Disorder of bone, unspecified: Secondary | ICD-10-CM

## 2019-04-16 DIAGNOSIS — Z01419 Encounter for gynecological examination (general) (routine) without abnormal findings: Secondary | ICD-10-CM | POA: Diagnosis not present

## 2019-04-16 DIAGNOSIS — Z113 Encounter for screening for infections with a predominantly sexual mode of transmission: Secondary | ICD-10-CM | POA: Diagnosis not present

## 2019-04-16 NOTE — Addendum Note (Signed)
Addended by: Hadley Pen R on: 04/16/2019 08:27 AM   Modules accepted: Orders

## 2019-04-16 NOTE — Progress Notes (Signed)
Subjective: Frances Bowman is a 19 y.o. is seen today in office s/p left ankle osteochondral repair of the talus utilizing Denovo cartilage of medial malleolus osteotomy preformed on 01/27/2019.  She states that she is having no significant pain at all to the ankle and her only concern is that she still gets pain on the arch of the foot and she points on the distal portion of the plantar fascia.  She has been walking in the cam boot.  No increase in swelling or pain.  She denies any fevers, chills, nausea, vomiting.  No calf pain, chest pain, shortness of breath.  Objective: General: No acute distress, AAOx3  DP/PT pulses palpable 2/4, CRT < 3 sec to all digits.  Protective sensation intact. Motor function intact.  LEFT ankle: Incision is well coapted without any evidence of dehiscence and a scar has formed.  There is no tenderness palpation of the ankle joint there is no pain or crepitation or restriction with ankle joint range of motion.  She does get discomfort along the medial band plantar fascial of the distal aspect of the foot.  Plantar fascial peers to be intact.  There is no edema to the foot there is no erythema or warmth. No other open lesions or pre-ulcerative lesions.  No pain with calf compression, swelling, warmth, erythema.   Assessment and Plan:  Status post left ankle surgery, nonunion; plantar fasciitis  -Treatment options discussed including all alternatives, risks, and complications -X-rays obtained reviewed.  Hardware appears to be intact and unchanged in position.  Still radiolucent line along the osteotomy site of the medial malleolus with a fracture gap of approximately 2 mm consistent with a nonunion. -Although she is now having discomfort on x-ray it is not healed as expected.  We will order a bone stimulator for her. -We will start physical therapy to work on plantar fasciitis.  Order was written today for [employer].   Return in about 3 weeks (around  05/06/2019).  Vivi Barrack DPM

## 2019-04-23 DIAGNOSIS — Z113 Encounter for screening for infections with a predominantly sexual mode of transmission: Secondary | ICD-10-CM | POA: Diagnosis not present

## 2019-04-26 DIAGNOSIS — M9903 Segmental and somatic dysfunction of lumbar region: Secondary | ICD-10-CM | POA: Diagnosis not present

## 2019-04-26 DIAGNOSIS — M9902 Segmental and somatic dysfunction of thoracic region: Secondary | ICD-10-CM | POA: Diagnosis not present

## 2019-04-26 DIAGNOSIS — M9901 Segmental and somatic dysfunction of cervical region: Secondary | ICD-10-CM | POA: Diagnosis not present

## 2019-04-28 DIAGNOSIS — M62572 Muscle wasting and atrophy, not elsewhere classified, left ankle and foot: Secondary | ICD-10-CM | POA: Diagnosis not present

## 2019-04-28 DIAGNOSIS — M62562 Muscle wasting and atrophy, not elsewhere classified, left lower leg: Secondary | ICD-10-CM | POA: Diagnosis not present

## 2019-04-28 DIAGNOSIS — M25672 Stiffness of left ankle, not elsewhere classified: Secondary | ICD-10-CM | POA: Diagnosis not present

## 2019-04-28 DIAGNOSIS — R269 Unspecified abnormalities of gait and mobility: Secondary | ICD-10-CM | POA: Diagnosis not present

## 2019-04-30 DIAGNOSIS — R269 Unspecified abnormalities of gait and mobility: Secondary | ICD-10-CM | POA: Diagnosis not present

## 2019-04-30 DIAGNOSIS — S8255XK Nondisplaced fracture of medial malleolus of left tibia, subsequent encounter for closed fracture with nonunion: Secondary | ICD-10-CM | POA: Diagnosis not present

## 2019-04-30 DIAGNOSIS — M25672 Stiffness of left ankle, not elsewhere classified: Secondary | ICD-10-CM | POA: Diagnosis not present

## 2019-04-30 DIAGNOSIS — M62572 Muscle wasting and atrophy, not elsewhere classified, left ankle and foot: Secondary | ICD-10-CM | POA: Diagnosis not present

## 2019-04-30 DIAGNOSIS — M62562 Muscle wasting and atrophy, not elsewhere classified, left lower leg: Secondary | ICD-10-CM | POA: Diagnosis not present

## 2019-05-04 ENCOUNTER — Ambulatory Visit (INDEPENDENT_AMBULATORY_CARE_PROVIDER_SITE_OTHER): Payer: BC Managed Care – PPO

## 2019-05-04 ENCOUNTER — Ambulatory Visit (INDEPENDENT_AMBULATORY_CARE_PROVIDER_SITE_OTHER): Payer: Self-pay | Admitting: Podiatry

## 2019-05-04 ENCOUNTER — Other Ambulatory Visit: Payer: Self-pay

## 2019-05-04 ENCOUNTER — Encounter: Payer: Self-pay | Admitting: Podiatry

## 2019-05-04 DIAGNOSIS — M899 Disorder of bone, unspecified: Secondary | ICD-10-CM | POA: Diagnosis not present

## 2019-05-04 DIAGNOSIS — S8255XK Nondisplaced fracture of medial malleolus of left tibia, subsequent encounter for closed fracture with nonunion: Secondary | ICD-10-CM

## 2019-05-04 DIAGNOSIS — M949 Disorder of cartilage, unspecified: Secondary | ICD-10-CM | POA: Diagnosis not present

## 2019-05-05 DIAGNOSIS — M62562 Muscle wasting and atrophy, not elsewhere classified, left lower leg: Secondary | ICD-10-CM | POA: Diagnosis not present

## 2019-05-05 DIAGNOSIS — M25672 Stiffness of left ankle, not elsewhere classified: Secondary | ICD-10-CM | POA: Diagnosis not present

## 2019-05-05 DIAGNOSIS — M62572 Muscle wasting and atrophy, not elsewhere classified, left ankle and foot: Secondary | ICD-10-CM | POA: Diagnosis not present

## 2019-05-05 DIAGNOSIS — R269 Unspecified abnormalities of gait and mobility: Secondary | ICD-10-CM | POA: Diagnosis not present

## 2019-05-06 ENCOUNTER — Encounter: Payer: BC Managed Care – PPO | Admitting: Podiatry

## 2019-05-07 DIAGNOSIS — M62562 Muscle wasting and atrophy, not elsewhere classified, left lower leg: Secondary | ICD-10-CM | POA: Diagnosis not present

## 2019-05-07 DIAGNOSIS — M62572 Muscle wasting and atrophy, not elsewhere classified, left ankle and foot: Secondary | ICD-10-CM | POA: Diagnosis not present

## 2019-05-07 DIAGNOSIS — R269 Unspecified abnormalities of gait and mobility: Secondary | ICD-10-CM | POA: Diagnosis not present

## 2019-05-07 DIAGNOSIS — M25672 Stiffness of left ankle, not elsewhere classified: Secondary | ICD-10-CM | POA: Diagnosis not present

## 2019-05-11 NOTE — Progress Notes (Signed)
Subjective: Frances Bowman is a 19 y.o. is seen today in office s/p left ankle osteochondral repair of the talus utilizing Denovo cartilage of medial malleolus osteotomy preformed on 01/27/2019.  She just received a bone stimulator yesterday.  She has been doing physical therapy patient has some swelling left foot there is no significant pain.  Otherwise has been doing well and has been continuing to walk in the cam boot. Denies any fevers, chills, nausea, vomiting.  No calf pain, chest pain, shortness of breath.  Objective: General: No acute distress, AAOx3  DP/PT pulses palpable 2/4, CRT < 3 sec to all digits.  Protective sensation intact. Motor function intact.  LEFT ankle: Incision is well coapted without any evidence of dehiscence and a scar has formed. There is no significant tenderness at the surgical site particularly over the medial malleolus there is no pain.  There is no significant edema there is no erythema or warmth.  Ankle range of motion intact but any crepitation or restriction.  No pain with range of motion. No other open lesions or pre-ulcerative lesions.  No pain with calf compression, swelling, warmth, erythema.   Assessment and Plan:  Status post left ankle surgery, nonunion; plantar fasciitis  -Treatment options discussed including all alternatives, risks, and complications -X-rays obtained reviewed.  Hardware appears to be intact and unchanged in position.  Still radiolucent line along the osteotomy site of the medial malleolus with a fracture gap some mild consolidation. -She just received approximator yesterday and she is can start to use those on today's visit.  She has no questions about how to use this. -Continue physical therapy to work on general strengthening rehab.  Should there be any increasing pain or significant swelling after therapy does not improve to let me know.  Vivi Barrack DPM

## 2019-05-12 DIAGNOSIS — M62562 Muscle wasting and atrophy, not elsewhere classified, left lower leg: Secondary | ICD-10-CM | POA: Diagnosis not present

## 2019-05-12 DIAGNOSIS — M62572 Muscle wasting and atrophy, not elsewhere classified, left ankle and foot: Secondary | ICD-10-CM | POA: Diagnosis not present

## 2019-05-12 DIAGNOSIS — R269 Unspecified abnormalities of gait and mobility: Secondary | ICD-10-CM | POA: Diagnosis not present

## 2019-05-12 DIAGNOSIS — M25672 Stiffness of left ankle, not elsewhere classified: Secondary | ICD-10-CM | POA: Diagnosis not present

## 2019-05-17 DIAGNOSIS — M9902 Segmental and somatic dysfunction of thoracic region: Secondary | ICD-10-CM | POA: Diagnosis not present

## 2019-05-17 DIAGNOSIS — M9901 Segmental and somatic dysfunction of cervical region: Secondary | ICD-10-CM | POA: Diagnosis not present

## 2019-05-17 DIAGNOSIS — M9903 Segmental and somatic dysfunction of lumbar region: Secondary | ICD-10-CM | POA: Diagnosis not present

## 2019-05-19 ENCOUNTER — Telehealth: Payer: Self-pay | Admitting: Podiatry

## 2019-05-19 DIAGNOSIS — M62562 Muscle wasting and atrophy, not elsewhere classified, left lower leg: Secondary | ICD-10-CM | POA: Diagnosis not present

## 2019-05-19 DIAGNOSIS — R269 Unspecified abnormalities of gait and mobility: Secondary | ICD-10-CM | POA: Diagnosis not present

## 2019-05-19 DIAGNOSIS — M25672 Stiffness of left ankle, not elsewhere classified: Secondary | ICD-10-CM | POA: Diagnosis not present

## 2019-05-19 DIAGNOSIS — M62572 Muscle wasting and atrophy, not elsewhere classified, left ankle and foot: Secondary | ICD-10-CM | POA: Diagnosis not present

## 2019-05-19 NOTE — Telephone Encounter (Signed)
Yes, that wound be fine

## 2019-05-19 NOTE — Telephone Encounter (Signed)
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 Would like any advice possible

## 2019-05-19 NOTE — Telephone Encounter (Signed)
Hey Dr Ardelle Anton can you please advise and I will call patient back. [name]

## 2019-05-19 NOTE — Telephone Encounter (Signed)
Called the patient back and stated per Dr Ardelle Anton, patient could go back to work and I asked if patient needed a work note and patient stated that she did not need a note but if she had to have one patient would call back. [name]

## 2019-05-21 DIAGNOSIS — M62572 Muscle wasting and atrophy, not elsewhere classified, left ankle and foot: Secondary | ICD-10-CM | POA: Diagnosis not present

## 2019-05-21 DIAGNOSIS — M25672 Stiffness of left ankle, not elsewhere classified: Secondary | ICD-10-CM | POA: Diagnosis not present

## 2019-05-21 DIAGNOSIS — R269 Unspecified abnormalities of gait and mobility: Secondary | ICD-10-CM | POA: Diagnosis not present

## 2019-05-21 DIAGNOSIS — M62562 Muscle wasting and atrophy, not elsewhere classified, left lower leg: Secondary | ICD-10-CM | POA: Diagnosis not present

## 2019-05-25 DIAGNOSIS — M62572 Muscle wasting and atrophy, not elsewhere classified, left ankle and foot: Secondary | ICD-10-CM | POA: Diagnosis not present

## 2019-05-25 DIAGNOSIS — M62562 Muscle wasting and atrophy, not elsewhere classified, left lower leg: Secondary | ICD-10-CM | POA: Diagnosis not present

## 2019-05-25 DIAGNOSIS — R269 Unspecified abnormalities of gait and mobility: Secondary | ICD-10-CM | POA: Diagnosis not present

## 2019-05-25 DIAGNOSIS — M25672 Stiffness of left ankle, not elsewhere classified: Secondary | ICD-10-CM | POA: Diagnosis not present

## 2019-05-27 DIAGNOSIS — M25672 Stiffness of left ankle, not elsewhere classified: Secondary | ICD-10-CM | POA: Diagnosis not present

## 2019-05-27 DIAGNOSIS — R269 Unspecified abnormalities of gait and mobility: Secondary | ICD-10-CM | POA: Diagnosis not present

## 2019-05-27 DIAGNOSIS — M62572 Muscle wasting and atrophy, not elsewhere classified, left ankle and foot: Secondary | ICD-10-CM | POA: Diagnosis not present

## 2019-05-27 DIAGNOSIS — M62562 Muscle wasting and atrophy, not elsewhere classified, left lower leg: Secondary | ICD-10-CM | POA: Diagnosis not present

## 2019-06-01 DIAGNOSIS — M62572 Muscle wasting and atrophy, not elsewhere classified, left ankle and foot: Secondary | ICD-10-CM | POA: Diagnosis not present

## 2019-06-01 DIAGNOSIS — M25672 Stiffness of left ankle, not elsewhere classified: Secondary | ICD-10-CM | POA: Diagnosis not present

## 2019-06-01 DIAGNOSIS — M62562 Muscle wasting and atrophy, not elsewhere classified, left lower leg: Secondary | ICD-10-CM | POA: Diagnosis not present

## 2019-06-01 DIAGNOSIS — R269 Unspecified abnormalities of gait and mobility: Secondary | ICD-10-CM | POA: Diagnosis not present

## 2019-06-03 ENCOUNTER — Encounter: Payer: Self-pay | Admitting: Podiatry

## 2019-06-03 ENCOUNTER — Ambulatory Visit (INDEPENDENT_AMBULATORY_CARE_PROVIDER_SITE_OTHER): Payer: BC Managed Care – PPO | Admitting: Podiatry

## 2019-06-03 ENCOUNTER — Ambulatory Visit (INDEPENDENT_AMBULATORY_CARE_PROVIDER_SITE_OTHER): Payer: BC Managed Care – PPO

## 2019-06-03 ENCOUNTER — Other Ambulatory Visit: Payer: Self-pay

## 2019-06-03 VITALS — Temp 96.9°F

## 2019-06-03 DIAGNOSIS — M899 Disorder of bone, unspecified: Secondary | ICD-10-CM | POA: Diagnosis not present

## 2019-06-03 DIAGNOSIS — S8255XK Nondisplaced fracture of medial malleolus of left tibia, subsequent encounter for closed fracture with nonunion: Secondary | ICD-10-CM

## 2019-06-03 DIAGNOSIS — M949 Disorder of cartilage, unspecified: Secondary | ICD-10-CM

## 2019-06-03 NOTE — Progress Notes (Signed)
Subjective: Frances Bowman is a 19 y.o. is seen today in office s/p left ankle osteochondral repair of the talus utilizing Denovo cartilage of medial malleolus osteotomy preformed on 01/27/2019.  She states that she has been doing well.  She has been using a bone stimulator every day.  She is been doing physical therapy.  She gets some mild swelling discomfort after physical therapy.  The symptoms do dissipate quickly. Denies any fevers, chills, nausea, vomiting.  No calf pain, chest pain, shortness of breath.  Objective: General: No acute distress, AAOx3  DP/PT pulses palpable 2/4, CRT < 3 sec to all digits.  Protective sensation intact. Motor function intact.  LEFT ankle: Incision is well coapted without any evidence of dehiscence and a scar has formed.  There is minimal discomfort  on surgical site but there is no sudden movement there is no erythema warmth.  Ankle joint range of motion intact but any crepitation or restriction.  There is no pain with ankle joint motion.   No other open lesions or pre-ulcerative lesions.  No pain with calf compression, swelling, warmth, erythema.   Assessment and Plan:  Status post left ankle surgery, nonunion  -Treatment options discussed including all alternatives, risks, and complications -X-rays obtained reviewed.  Hardware intact.  There does appear to be increased consolidation across the osteotomy site.  -As she is doing better discussed transition to a regular shoe over time with an ankle brace which is dispensed today.  She is having significant pain with going barefoot or doing barefoot exercises and therapy. -Continue bone stimulator -Continue to ice and elevate. -Also discussed this with other physician in the clinic today of the x-rays with them.  They are in agreement to the plan.  Return in about 4 weeks (around 07/01/2019) for post-op check/x-ray ankle .  Vivi Barrack DPM

## 2019-06-04 DIAGNOSIS — M62572 Muscle wasting and atrophy, not elsewhere classified, left ankle and foot: Secondary | ICD-10-CM | POA: Diagnosis not present

## 2019-06-04 DIAGNOSIS — R269 Unspecified abnormalities of gait and mobility: Secondary | ICD-10-CM | POA: Diagnosis not present

## 2019-06-04 DIAGNOSIS — M62562 Muscle wasting and atrophy, not elsewhere classified, left lower leg: Secondary | ICD-10-CM | POA: Diagnosis not present

## 2019-06-04 DIAGNOSIS — M25672 Stiffness of left ankle, not elsewhere classified: Secondary | ICD-10-CM | POA: Diagnosis not present

## 2019-06-07 DIAGNOSIS — M62562 Muscle wasting and atrophy, not elsewhere classified, left lower leg: Secondary | ICD-10-CM | POA: Diagnosis not present

## 2019-06-07 DIAGNOSIS — M62572 Muscle wasting and atrophy, not elsewhere classified, left ankle and foot: Secondary | ICD-10-CM | POA: Diagnosis not present

## 2019-06-07 DIAGNOSIS — R269 Unspecified abnormalities of gait and mobility: Secondary | ICD-10-CM | POA: Diagnosis not present

## 2019-06-07 DIAGNOSIS — M25672 Stiffness of left ankle, not elsewhere classified: Secondary | ICD-10-CM | POA: Diagnosis not present

## 2019-06-08 DIAGNOSIS — M9901 Segmental and somatic dysfunction of cervical region: Secondary | ICD-10-CM | POA: Diagnosis not present

## 2019-06-08 DIAGNOSIS — M9902 Segmental and somatic dysfunction of thoracic region: Secondary | ICD-10-CM | POA: Diagnosis not present

## 2019-06-08 DIAGNOSIS — M9903 Segmental and somatic dysfunction of lumbar region: Secondary | ICD-10-CM | POA: Diagnosis not present

## 2019-06-09 DIAGNOSIS — M25672 Stiffness of left ankle, not elsewhere classified: Secondary | ICD-10-CM | POA: Diagnosis not present

## 2019-06-09 DIAGNOSIS — M62562 Muscle wasting and atrophy, not elsewhere classified, left lower leg: Secondary | ICD-10-CM | POA: Diagnosis not present

## 2019-06-09 DIAGNOSIS — M62572 Muscle wasting and atrophy, not elsewhere classified, left ankle and foot: Secondary | ICD-10-CM | POA: Diagnosis not present

## 2019-06-09 DIAGNOSIS — R269 Unspecified abnormalities of gait and mobility: Secondary | ICD-10-CM | POA: Diagnosis not present

## 2019-06-14 DIAGNOSIS — M25672 Stiffness of left ankle, not elsewhere classified: Secondary | ICD-10-CM | POA: Diagnosis not present

## 2019-06-14 DIAGNOSIS — M62572 Muscle wasting and atrophy, not elsewhere classified, left ankle and foot: Secondary | ICD-10-CM | POA: Diagnosis not present

## 2019-06-14 DIAGNOSIS — R269 Unspecified abnormalities of gait and mobility: Secondary | ICD-10-CM | POA: Diagnosis not present

## 2019-06-14 DIAGNOSIS — M62562 Muscle wasting and atrophy, not elsewhere classified, left lower leg: Secondary | ICD-10-CM | POA: Diagnosis not present

## 2019-06-16 DIAGNOSIS — M62572 Muscle wasting and atrophy, not elsewhere classified, left ankle and foot: Secondary | ICD-10-CM | POA: Diagnosis not present

## 2019-06-16 DIAGNOSIS — R269 Unspecified abnormalities of gait and mobility: Secondary | ICD-10-CM | POA: Diagnosis not present

## 2019-06-16 DIAGNOSIS — M62562 Muscle wasting and atrophy, not elsewhere classified, left lower leg: Secondary | ICD-10-CM | POA: Diagnosis not present

## 2019-06-16 DIAGNOSIS — M25672 Stiffness of left ankle, not elsewhere classified: Secondary | ICD-10-CM | POA: Diagnosis not present

## 2019-06-21 DIAGNOSIS — M62572 Muscle wasting and atrophy, not elsewhere classified, left ankle and foot: Secondary | ICD-10-CM | POA: Diagnosis not present

## 2019-06-21 DIAGNOSIS — M62562 Muscle wasting and atrophy, not elsewhere classified, left lower leg: Secondary | ICD-10-CM | POA: Diagnosis not present

## 2019-06-21 DIAGNOSIS — M25672 Stiffness of left ankle, not elsewhere classified: Secondary | ICD-10-CM | POA: Diagnosis not present

## 2019-06-21 DIAGNOSIS — R269 Unspecified abnormalities of gait and mobility: Secondary | ICD-10-CM | POA: Diagnosis not present

## 2019-06-23 DIAGNOSIS — M62572 Muscle wasting and atrophy, not elsewhere classified, left ankle and foot: Secondary | ICD-10-CM | POA: Diagnosis not present

## 2019-06-23 DIAGNOSIS — M25672 Stiffness of left ankle, not elsewhere classified: Secondary | ICD-10-CM | POA: Diagnosis not present

## 2019-06-23 DIAGNOSIS — R269 Unspecified abnormalities of gait and mobility: Secondary | ICD-10-CM | POA: Diagnosis not present

## 2019-06-23 DIAGNOSIS — M62562 Muscle wasting and atrophy, not elsewhere classified, left lower leg: Secondary | ICD-10-CM | POA: Diagnosis not present

## 2019-06-28 DIAGNOSIS — M25672 Stiffness of left ankle, not elsewhere classified: Secondary | ICD-10-CM | POA: Diagnosis not present

## 2019-06-28 DIAGNOSIS — M62562 Muscle wasting and atrophy, not elsewhere classified, left lower leg: Secondary | ICD-10-CM | POA: Diagnosis not present

## 2019-06-28 DIAGNOSIS — R269 Unspecified abnormalities of gait and mobility: Secondary | ICD-10-CM | POA: Diagnosis not present

## 2019-06-28 DIAGNOSIS — M62572 Muscle wasting and atrophy, not elsewhere classified, left ankle and foot: Secondary | ICD-10-CM | POA: Diagnosis not present

## 2019-06-30 DIAGNOSIS — M9901 Segmental and somatic dysfunction of cervical region: Secondary | ICD-10-CM | POA: Diagnosis not present

## 2019-06-30 DIAGNOSIS — M62572 Muscle wasting and atrophy, not elsewhere classified, left ankle and foot: Secondary | ICD-10-CM | POA: Diagnosis not present

## 2019-06-30 DIAGNOSIS — M9903 Segmental and somatic dysfunction of lumbar region: Secondary | ICD-10-CM | POA: Diagnosis not present

## 2019-06-30 DIAGNOSIS — M62562 Muscle wasting and atrophy, not elsewhere classified, left lower leg: Secondary | ICD-10-CM | POA: Diagnosis not present

## 2019-06-30 DIAGNOSIS — M9902 Segmental and somatic dysfunction of thoracic region: Secondary | ICD-10-CM | POA: Diagnosis not present

## 2019-06-30 DIAGNOSIS — M25672 Stiffness of left ankle, not elsewhere classified: Secondary | ICD-10-CM | POA: Diagnosis not present

## 2019-06-30 DIAGNOSIS — R269 Unspecified abnormalities of gait and mobility: Secondary | ICD-10-CM | POA: Diagnosis not present

## 2019-07-01 ENCOUNTER — Encounter: Payer: BC Managed Care – PPO | Admitting: Podiatry

## 2019-07-05 ENCOUNTER — Ambulatory Visit (INDEPENDENT_AMBULATORY_CARE_PROVIDER_SITE_OTHER): Payer: BC Managed Care – PPO

## 2019-07-05 ENCOUNTER — Encounter: Payer: Self-pay | Admitting: Podiatry

## 2019-07-05 ENCOUNTER — Other Ambulatory Visit: Payer: Self-pay

## 2019-07-05 ENCOUNTER — Ambulatory Visit (INDEPENDENT_AMBULATORY_CARE_PROVIDER_SITE_OTHER): Payer: BC Managed Care – PPO | Admitting: Podiatry

## 2019-07-05 VITALS — Temp 97.1°F

## 2019-07-05 DIAGNOSIS — M949 Disorder of cartilage, unspecified: Secondary | ICD-10-CM

## 2019-07-05 DIAGNOSIS — R269 Unspecified abnormalities of gait and mobility: Secondary | ICD-10-CM | POA: Diagnosis not present

## 2019-07-05 DIAGNOSIS — M25672 Stiffness of left ankle, not elsewhere classified: Secondary | ICD-10-CM | POA: Diagnosis not present

## 2019-07-05 DIAGNOSIS — M779 Enthesopathy, unspecified: Secondary | ICD-10-CM

## 2019-07-05 DIAGNOSIS — M778 Other enthesopathies, not elsewhere classified: Secondary | ICD-10-CM | POA: Diagnosis not present

## 2019-07-05 DIAGNOSIS — M722 Plantar fascial fibromatosis: Secondary | ICD-10-CM

## 2019-07-05 DIAGNOSIS — M62562 Muscle wasting and atrophy, not elsewhere classified, left lower leg: Secondary | ICD-10-CM | POA: Diagnosis not present

## 2019-07-05 DIAGNOSIS — M899 Disorder of bone, unspecified: Secondary | ICD-10-CM

## 2019-07-05 DIAGNOSIS — M62572 Muscle wasting and atrophy, not elsewhere classified, left ankle and foot: Secondary | ICD-10-CM | POA: Diagnosis not present

## 2019-07-06 NOTE — Progress Notes (Signed)
Subjective: Frances Bowman is a 19 y.o. is seen today in office s/p left ankle osteochondral repair of the talus utilizing Denovo cartilage of medial malleolus osteotomy preformed on 01/27/2019.  Regards to the ankle she is doing fine she has no pain or swelling to the ankle but she does get some pain in the arch of her foot as well as the ball of her foot at times.  She still doing physical therapy.  She feels tightness and muscle pain in the arch of her foot.  She is some occasional intermittent swelling but no redness.  She has some intermittent swelling but is not consistent.  Denies any fevers, chills, nausea, vomiting.  No calf pain, chest pain, shortness of breath.  Objective: General: No acute distress, AAOx3  DP/PT pulses palpable 2/4, CRT < 3 sec to all digits.  Protective sensation intact. Motor function intact.  LEFT ankle: Incision is well coapted without any evidence of dehiscence and a scar has formed.  There is no discomfort of the ankle joint there is no pain with ankle joint range of motion.  She has good discomfort on the medial band plantar fashion the arch of the foot.  Mild discomfort of the first interspace as well but there is no area of pinpoint tenderness. No other open lesions or pre-ulcerative lesions.  No pain with calf compression, swelling, warmth, erythema.   Assessment and Plan:  Status post left ankle surgery, nonunion with improvement; foot pain  -Treatment options discussed including all alternatives, risks, and complications -X-rays obtained reviewed.  Hardware intact.  There does appear to be increased consolidation across the osteotomy site.  X-ray of the foot did not reveal any evidence of acute fracture or stress fracture. -Discussed with her trying to wean off the ankle brace as well as she is doing better.  Continue physical therapy.  Try to wear shoes with better arch support and hopefully try to walk more normal will help with her pain.  For now I did  dispense a graphite insert to see if this will help with the foot pain.  Discussed with exercises, activities that she can do to take pressure off the ankle to keep her active.  Return in about 4 weeks (around 08/02/2019).  Vivi Barrack DPM

## 2019-07-08 DIAGNOSIS — R269 Unspecified abnormalities of gait and mobility: Secondary | ICD-10-CM | POA: Diagnosis not present

## 2019-07-08 DIAGNOSIS — M62562 Muscle wasting and atrophy, not elsewhere classified, left lower leg: Secondary | ICD-10-CM | POA: Diagnosis not present

## 2019-07-08 DIAGNOSIS — M62572 Muscle wasting and atrophy, not elsewhere classified, left ankle and foot: Secondary | ICD-10-CM | POA: Diagnosis not present

## 2019-07-08 DIAGNOSIS — M25672 Stiffness of left ankle, not elsewhere classified: Secondary | ICD-10-CM | POA: Diagnosis not present

## 2019-07-12 DIAGNOSIS — R269 Unspecified abnormalities of gait and mobility: Secondary | ICD-10-CM | POA: Diagnosis not present

## 2019-07-12 DIAGNOSIS — M25672 Stiffness of left ankle, not elsewhere classified: Secondary | ICD-10-CM | POA: Diagnosis not present

## 2019-07-12 DIAGNOSIS — M62572 Muscle wasting and atrophy, not elsewhere classified, left ankle and foot: Secondary | ICD-10-CM | POA: Diagnosis not present

## 2019-07-12 DIAGNOSIS — M62562 Muscle wasting and atrophy, not elsewhere classified, left lower leg: Secondary | ICD-10-CM | POA: Diagnosis not present

## 2019-07-21 DIAGNOSIS — M25672 Stiffness of left ankle, not elsewhere classified: Secondary | ICD-10-CM | POA: Diagnosis not present

## 2019-07-21 DIAGNOSIS — R269 Unspecified abnormalities of gait and mobility: Secondary | ICD-10-CM | POA: Diagnosis not present

## 2019-07-21 DIAGNOSIS — M62562 Muscle wasting and atrophy, not elsewhere classified, left lower leg: Secondary | ICD-10-CM | POA: Diagnosis not present

## 2019-07-21 DIAGNOSIS — M62572 Muscle wasting and atrophy, not elsewhere classified, left ankle and foot: Secondary | ICD-10-CM | POA: Diagnosis not present

## 2019-07-23 DIAGNOSIS — M62562 Muscle wasting and atrophy, not elsewhere classified, left lower leg: Secondary | ICD-10-CM | POA: Diagnosis not present

## 2019-07-23 DIAGNOSIS — M25672 Stiffness of left ankle, not elsewhere classified: Secondary | ICD-10-CM | POA: Diagnosis not present

## 2019-07-23 DIAGNOSIS — R269 Unspecified abnormalities of gait and mobility: Secondary | ICD-10-CM | POA: Diagnosis not present

## 2019-07-23 DIAGNOSIS — M62572 Muscle wasting and atrophy, not elsewhere classified, left ankle and foot: Secondary | ICD-10-CM | POA: Diagnosis not present

## 2019-07-26 DIAGNOSIS — R269 Unspecified abnormalities of gait and mobility: Secondary | ICD-10-CM | POA: Diagnosis not present

## 2019-07-26 DIAGNOSIS — M62572 Muscle wasting and atrophy, not elsewhere classified, left ankle and foot: Secondary | ICD-10-CM | POA: Diagnosis not present

## 2019-07-26 DIAGNOSIS — M25672 Stiffness of left ankle, not elsewhere classified: Secondary | ICD-10-CM | POA: Diagnosis not present

## 2019-07-26 DIAGNOSIS — M62562 Muscle wasting and atrophy, not elsewhere classified, left lower leg: Secondary | ICD-10-CM | POA: Diagnosis not present

## 2019-07-27 DIAGNOSIS — Z20828 Contact with and (suspected) exposure to other viral communicable diseases: Secondary | ICD-10-CM | POA: Diagnosis not present

## 2019-07-27 DIAGNOSIS — Z03818 Encounter for observation for suspected exposure to other biological agents ruled out: Secondary | ICD-10-CM | POA: Diagnosis not present

## 2019-07-30 DIAGNOSIS — M25672 Stiffness of left ankle, not elsewhere classified: Secondary | ICD-10-CM | POA: Diagnosis not present

## 2019-07-30 DIAGNOSIS — R269 Unspecified abnormalities of gait and mobility: Secondary | ICD-10-CM | POA: Diagnosis not present

## 2019-07-30 DIAGNOSIS — M62562 Muscle wasting and atrophy, not elsewhere classified, left lower leg: Secondary | ICD-10-CM | POA: Diagnosis not present

## 2019-07-30 DIAGNOSIS — M62572 Muscle wasting and atrophy, not elsewhere classified, left ankle and foot: Secondary | ICD-10-CM | POA: Diagnosis not present

## 2019-07-30 DIAGNOSIS — M9902 Segmental and somatic dysfunction of thoracic region: Secondary | ICD-10-CM | POA: Diagnosis not present

## 2019-07-30 DIAGNOSIS — M9903 Segmental and somatic dysfunction of lumbar region: Secondary | ICD-10-CM | POA: Diagnosis not present

## 2019-07-30 DIAGNOSIS — M9901 Segmental and somatic dysfunction of cervical region: Secondary | ICD-10-CM | POA: Diagnosis not present

## 2019-08-02 DIAGNOSIS — M62562 Muscle wasting and atrophy, not elsewhere classified, left lower leg: Secondary | ICD-10-CM | POA: Diagnosis not present

## 2019-08-02 DIAGNOSIS — R269 Unspecified abnormalities of gait and mobility: Secondary | ICD-10-CM | POA: Diagnosis not present

## 2019-08-02 DIAGNOSIS — M62572 Muscle wasting and atrophy, not elsewhere classified, left ankle and foot: Secondary | ICD-10-CM | POA: Diagnosis not present

## 2019-08-02 DIAGNOSIS — M25672 Stiffness of left ankle, not elsewhere classified: Secondary | ICD-10-CM | POA: Diagnosis not present

## 2019-08-03 ENCOUNTER — Other Ambulatory Visit: Payer: Self-pay

## 2019-08-03 ENCOUNTER — Ambulatory Visit (INDEPENDENT_AMBULATORY_CARE_PROVIDER_SITE_OTHER): Payer: BC Managed Care – PPO

## 2019-08-03 ENCOUNTER — Ambulatory Visit (INDEPENDENT_AMBULATORY_CARE_PROVIDER_SITE_OTHER): Payer: BC Managed Care – PPO | Admitting: Podiatrist

## 2019-08-03 ENCOUNTER — Encounter: Payer: Self-pay | Admitting: Podiatrist

## 2019-08-03 DIAGNOSIS — M949 Disorder of cartilage, unspecified: Secondary | ICD-10-CM

## 2019-08-03 DIAGNOSIS — M899 Disorder of bone, unspecified: Secondary | ICD-10-CM | POA: Diagnosis not present

## 2019-08-03 DIAGNOSIS — M779 Enthesopathy, unspecified: Secondary | ICD-10-CM

## 2019-08-03 DIAGNOSIS — Z9889 Other specified postprocedural states: Secondary | ICD-10-CM

## 2019-08-03 NOTE — Progress Notes (Signed)
   Subjective: Patient presents today status post left ankle osteochondral repair of the talus utilizing cartilage from the medial malleolus osteotomy which was all performed on 01/27/2019.  Patient states that she is doing well she notices some popping and catching when she is at work.  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  She continues to wear her ankle brace while at work and relates that she wears a good supportive sneaker while at work as well.  She continues to participate in physical therapy and states that overall she feels like her foot is improving.  Objective: Neurovascular status is intact and unchanged.  Incision sites are well coapted and there is a moderate scar formation.  Palpation reveals no crepitus, no popping, no pain with pressure.  No repetition of symptoms at work is produced.  X-rays are taken at today's visit hardware is in good alignment and position.  Increased consolidation along the tibial osteotomy is seen.  Overall well-healing surgical ankle  Assessment: Status post left ankle surgery  Plan: Discussed with her she could try a smaller ankle brace at work to see if this would help with compression as well as possibly reduce the popping and clicking she is experiencing.  She will continue to participate in physical therapy and she will be back in 4 weeks for follow-up with Dr. Ardelle Anton.

## 2019-08-09 DIAGNOSIS — M25672 Stiffness of left ankle, not elsewhere classified: Secondary | ICD-10-CM | POA: Diagnosis not present

## 2019-08-09 DIAGNOSIS — R269 Unspecified abnormalities of gait and mobility: Secondary | ICD-10-CM | POA: Diagnosis not present

## 2019-08-09 DIAGNOSIS — M62562 Muscle wasting and atrophy, not elsewhere classified, left lower leg: Secondary | ICD-10-CM | POA: Diagnosis not present

## 2019-08-09 DIAGNOSIS — M62572 Muscle wasting and atrophy, not elsewhere classified, left ankle and foot: Secondary | ICD-10-CM | POA: Diagnosis not present

## 2019-08-12 DIAGNOSIS — R269 Unspecified abnormalities of gait and mobility: Secondary | ICD-10-CM | POA: Diagnosis not present

## 2019-08-12 DIAGNOSIS — M62572 Muscle wasting and atrophy, not elsewhere classified, left ankle and foot: Secondary | ICD-10-CM | POA: Diagnosis not present

## 2019-08-12 DIAGNOSIS — M25672 Stiffness of left ankle, not elsewhere classified: Secondary | ICD-10-CM | POA: Diagnosis not present

## 2019-08-12 DIAGNOSIS — M62562 Muscle wasting and atrophy, not elsewhere classified, left lower leg: Secondary | ICD-10-CM | POA: Diagnosis not present

## 2019-08-16 DIAGNOSIS — M62572 Muscle wasting and atrophy, not elsewhere classified, left ankle and foot: Secondary | ICD-10-CM | POA: Diagnosis not present

## 2019-08-16 DIAGNOSIS — M25672 Stiffness of left ankle, not elsewhere classified: Secondary | ICD-10-CM | POA: Diagnosis not present

## 2019-08-16 DIAGNOSIS — R269 Unspecified abnormalities of gait and mobility: Secondary | ICD-10-CM | POA: Diagnosis not present

## 2019-08-16 DIAGNOSIS — M62562 Muscle wasting and atrophy, not elsewhere classified, left lower leg: Secondary | ICD-10-CM | POA: Diagnosis not present

## 2019-08-20 DIAGNOSIS — M25672 Stiffness of left ankle, not elsewhere classified: Secondary | ICD-10-CM | POA: Diagnosis not present

## 2019-08-20 DIAGNOSIS — M62572 Muscle wasting and atrophy, not elsewhere classified, left ankle and foot: Secondary | ICD-10-CM | POA: Diagnosis not present

## 2019-08-20 DIAGNOSIS — M62562 Muscle wasting and atrophy, not elsewhere classified, left lower leg: Secondary | ICD-10-CM | POA: Diagnosis not present

## 2019-08-20 DIAGNOSIS — R269 Unspecified abnormalities of gait and mobility: Secondary | ICD-10-CM | POA: Diagnosis not present

## 2019-08-25 DIAGNOSIS — M9903 Segmental and somatic dysfunction of lumbar region: Secondary | ICD-10-CM | POA: Diagnosis not present

## 2019-08-25 DIAGNOSIS — M9902 Segmental and somatic dysfunction of thoracic region: Secondary | ICD-10-CM | POA: Diagnosis not present

## 2019-08-25 DIAGNOSIS — M9901 Segmental and somatic dysfunction of cervical region: Secondary | ICD-10-CM | POA: Diagnosis not present

## 2019-09-13 ENCOUNTER — Ambulatory Visit (INDEPENDENT_AMBULATORY_CARE_PROVIDER_SITE_OTHER): Payer: BC Managed Care – PPO | Admitting: Podiatry

## 2019-09-13 ENCOUNTER — Other Ambulatory Visit: Payer: Self-pay

## 2019-09-13 ENCOUNTER — Encounter: Payer: Self-pay | Admitting: Podiatry

## 2019-09-13 ENCOUNTER — Ambulatory Visit (INDEPENDENT_AMBULATORY_CARE_PROVIDER_SITE_OTHER): Payer: BC Managed Care – PPO

## 2019-09-13 DIAGNOSIS — M949 Disorder of cartilage, unspecified: Secondary | ICD-10-CM

## 2019-09-13 DIAGNOSIS — M779 Enthesopathy, unspecified: Secondary | ICD-10-CM | POA: Diagnosis not present

## 2019-09-13 DIAGNOSIS — M722 Plantar fascial fibromatosis: Secondary | ICD-10-CM

## 2019-09-13 DIAGNOSIS — M899 Disorder of bone, unspecified: Secondary | ICD-10-CM

## 2019-09-13 DIAGNOSIS — M2142 Flat foot [pes planus] (acquired), left foot: Secondary | ICD-10-CM

## 2019-09-13 DIAGNOSIS — M2141 Flat foot [pes planus] (acquired), right foot: Secondary | ICD-10-CM

## 2019-09-20 NOTE — Progress Notes (Signed)
Subjective: Frances Bowman is a 19 y.o. is seen today in office s/p left ankle osteochondral repair of the talus utilizing Denovo cartilage of medial malleolus osteotomy preformed on 01/27/2019.  She is back to doing normal activity when a regular shoe with an ankle brace.  She gets some popping sensations the ankle and occasional discomfort after being on her feet all day but otherwise she does feel it is improving.  No recent injury or falls.  She does have orthotics but they are several years old.  Denies any fevers, chills, nausea, vomiting.  No calf pain, chest pain, shortness of breath.  Objective: General: No acute distress, AAOx3  DP/PT pulses palpable 2/4, CRT < 3 sec to all digits.  Protective sensation intact. Motor function intact.  LEFT ankle: Incision is well coapted without any evidence of dehiscence and a scar has formed.  There is no significant tenderness palpation surgical site there is no pain or restriction with ankle to range of motion there is no crepitation with range of motion.  There is no pain on the course of the plantar fascial today.  Decreased medial arch height upon weightbearing. No other open lesions or pre-ulcerative lesions.  No pain with calf compression, swelling, warmth, erythema.   Assessment and Plan:  Status post left ankle surgery, nonunion with improvement; foot pain  -Treatment options discussed including all alternatives, risks, and complications -X-rays obtained reviewed.  Hardware intact.  There does appear to be increased consolidation across the osteotomy site and the hardware is intact but any loosening. -At this point she is back to doing normal activities and her pain continues to improve.  I would make her new orthotic or have her bring her old orthotics in for [place] to evaluate.  I will have her follow-up with Raiford Noble for this.  In regards to the surgery she is continue to improve and we will see her back as needed and she understand that she can call  anytime if there is any issues.  No follow-ups on file.  Vivi Barrack DPM

## 2019-09-21 ENCOUNTER — Other Ambulatory Visit: Payer: Self-pay

## 2019-09-21 ENCOUNTER — Ambulatory Visit (INDEPENDENT_AMBULATORY_CARE_PROVIDER_SITE_OTHER): Payer: BC Managed Care – PPO | Admitting: Orthotics

## 2019-09-21 DIAGNOSIS — M722 Plantar fascial fibromatosis: Secondary | ICD-10-CM

## 2019-09-21 DIAGNOSIS — M899 Disorder of bone, unspecified: Secondary | ICD-10-CM

## 2019-09-21 DIAGNOSIS — M949 Disorder of cartilage, unspecified: Secondary | ICD-10-CM

## 2019-09-21 DIAGNOSIS — M779 Enthesopathy, unspecified: Secondary | ICD-10-CM

## 2019-09-21 NOTE — Progress Notes (Signed)
Patient presents with old f//o that has pretty signiicant gap in arch; recast her for f/o with good arch support..3/4 length.

## 2019-09-23 DIAGNOSIS — Z713 Dietary counseling and surveillance: Secondary | ICD-10-CM | POA: Diagnosis not present

## 2019-10-12 ENCOUNTER — Other Ambulatory Visit: Payer: Self-pay

## 2019-10-12 ENCOUNTER — Ambulatory Visit: Payer: BC Managed Care – PPO | Admitting: Orthotics

## 2019-10-12 DIAGNOSIS — M949 Disorder of cartilage, unspecified: Secondary | ICD-10-CM

## 2019-10-12 DIAGNOSIS — M779 Enthesopathy, unspecified: Secondary | ICD-10-CM

## 2019-10-12 DIAGNOSIS — M722 Plantar fascial fibromatosis: Secondary | ICD-10-CM

## 2019-10-12 NOTE — Progress Notes (Signed)
Patient came in today to pick up custom made foot orthotics.  The goals were accomplished and the patient reported no dissatisfaction with said orthotics.  Patient was advised of breakin period and how to report any issues. 

## 2019-10-17 DIAGNOSIS — Z713 Dietary counseling and surveillance: Secondary | ICD-10-CM | POA: Diagnosis not present

## 2019-10-21 DIAGNOSIS — L700 Acne vulgaris: Secondary | ICD-10-CM | POA: Diagnosis not present

## 2019-11-10 DIAGNOSIS — Z91018 Allergy to other foods: Secondary | ICD-10-CM | POA: Diagnosis not present

## 2019-11-10 DIAGNOSIS — J309 Allergic rhinitis, unspecified: Secondary | ICD-10-CM | POA: Diagnosis not present

## 2019-11-12 IMAGING — CR DG KNEE COMPLETE 4+V*R*
4 series · 4 of 4 positions shown · non-contrast
Comparison: None.

CLINICAL DATA: Medial right knee pain for 2 weeks.

EXAM:
RIGHT KNEE - COMPLETE 4+ VIEW

[t knee ap right]
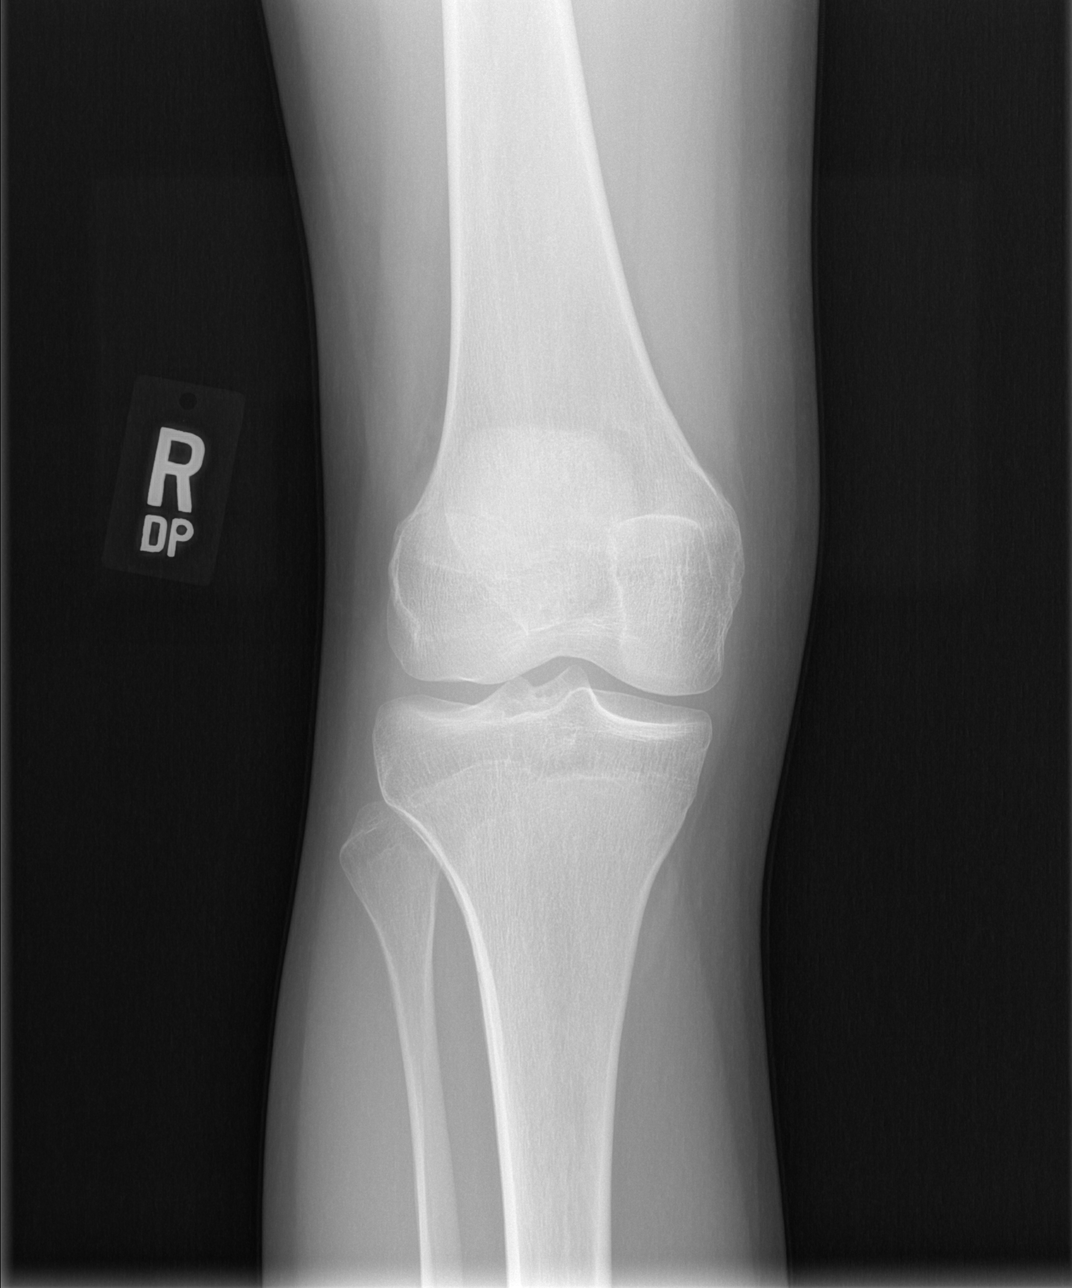

[t knee oblique right (1 of 2)]
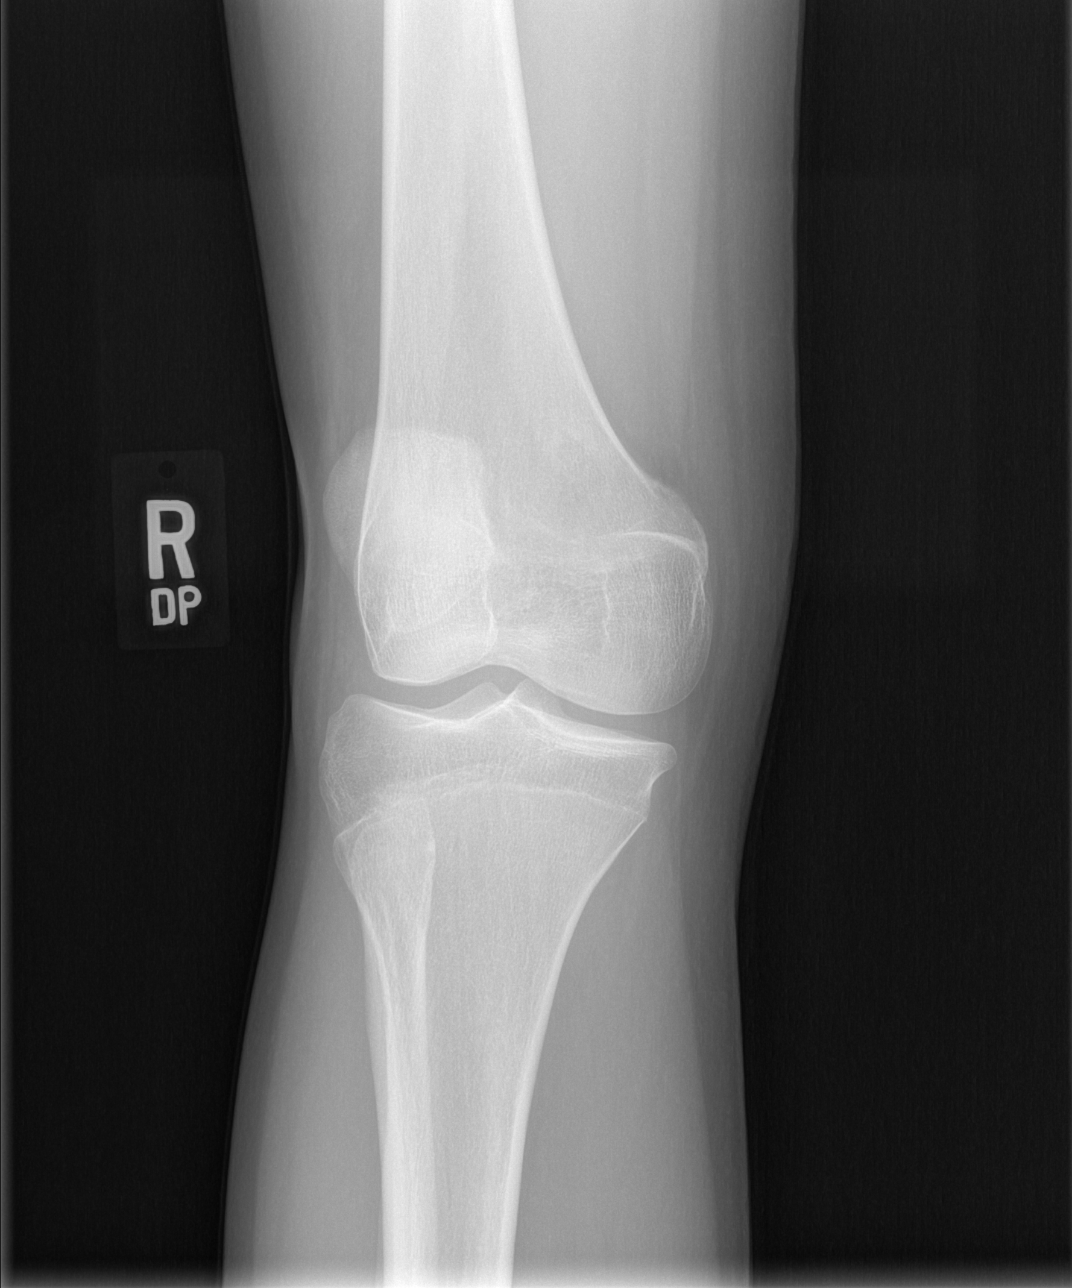

[t knee oblique right (2 of 2)]
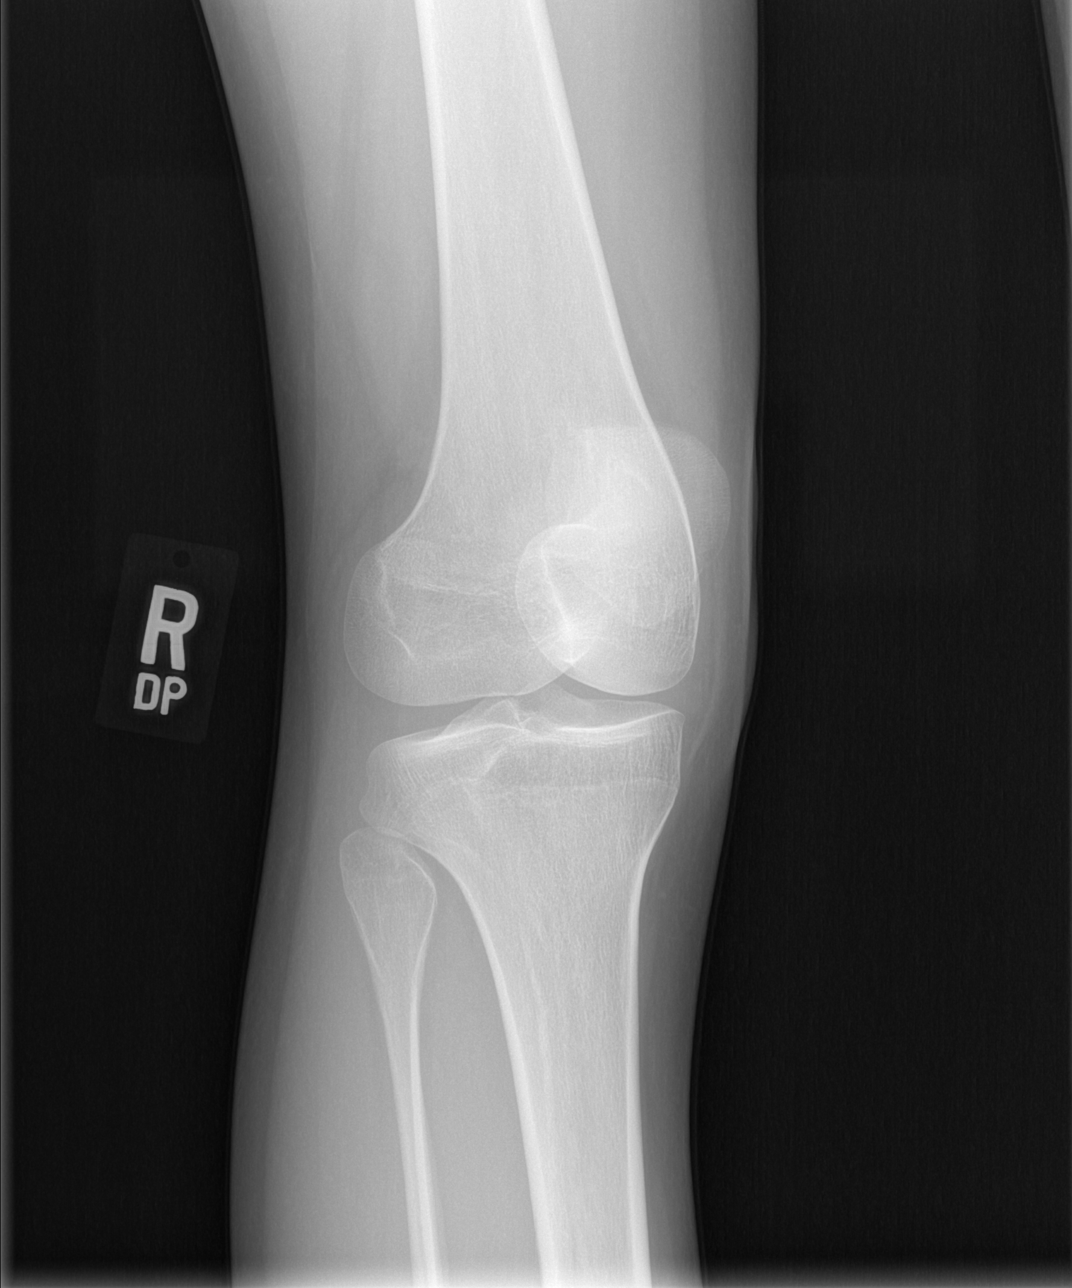

[t knee lat right]
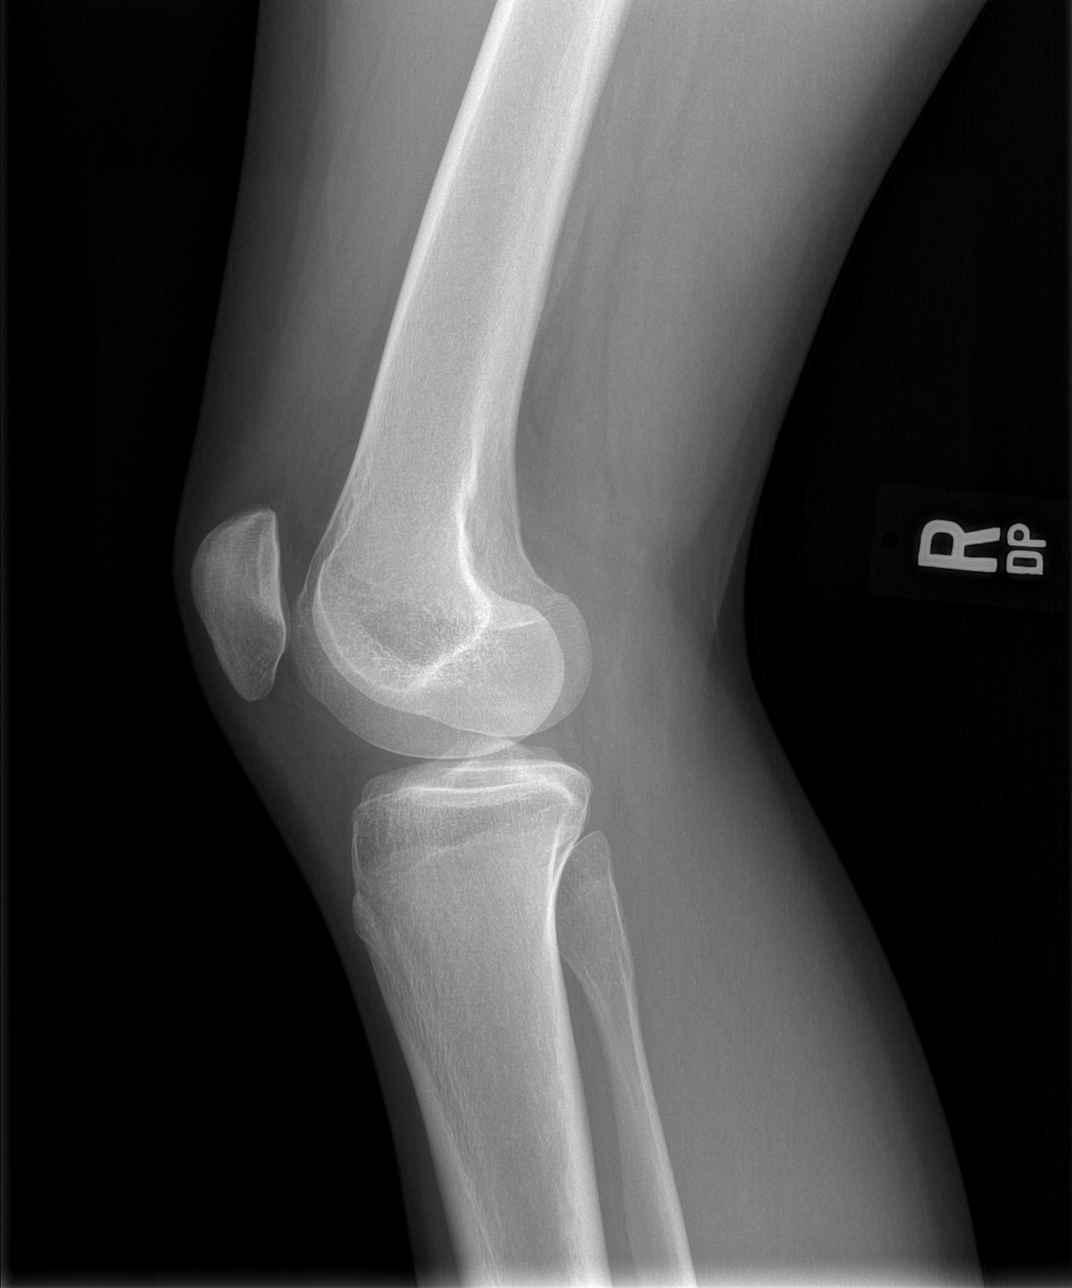

[4 of 4 positions shown; findings below may reference images not displayed]

FINDINGS: No evidence of fracture, dislocation, or joint effusion. No evidence
of arthropathy or other focal bone abnormality. Soft tissues are
unremarkable.
IMPRESSION: Negative.

## 2019-12-28 DIAGNOSIS — M25511 Pain in right shoulder: Secondary | ICD-10-CM | POA: Diagnosis not present

## 2020-01-14 DIAGNOSIS — M7520 Bicipital tendinitis, unspecified shoulder: Secondary | ICD-10-CM | POA: Diagnosis not present

## 2020-01-14 DIAGNOSIS — M9907 Segmental and somatic dysfunction of upper extremity: Secondary | ICD-10-CM | POA: Diagnosis not present

## 2020-01-14 DIAGNOSIS — M65849 Other synovitis and tenosynovitis, unspecified hand: Secondary | ICD-10-CM | POA: Diagnosis not present

## 2020-01-14 DIAGNOSIS — M9901 Segmental and somatic dysfunction of cervical region: Secondary | ICD-10-CM | POA: Diagnosis not present

## 2020-01-19 DIAGNOSIS — M9901 Segmental and somatic dysfunction of cervical region: Secondary | ICD-10-CM | POA: Diagnosis not present

## 2020-01-19 DIAGNOSIS — M9907 Segmental and somatic dysfunction of upper extremity: Secondary | ICD-10-CM | POA: Diagnosis not present

## 2020-01-19 DIAGNOSIS — M7520 Bicipital tendinitis, unspecified shoulder: Secondary | ICD-10-CM | POA: Diagnosis not present

## 2020-01-19 DIAGNOSIS — M65849 Other synovitis and tenosynovitis, unspecified hand: Secondary | ICD-10-CM | POA: Diagnosis not present

## 2020-01-21 DIAGNOSIS — M65849 Other synovitis and tenosynovitis, unspecified hand: Secondary | ICD-10-CM | POA: Diagnosis not present

## 2020-01-21 DIAGNOSIS — M9907 Segmental and somatic dysfunction of upper extremity: Secondary | ICD-10-CM | POA: Diagnosis not present

## 2020-01-21 DIAGNOSIS — M7520 Bicipital tendinitis, unspecified shoulder: Secondary | ICD-10-CM | POA: Diagnosis not present

## 2020-01-21 DIAGNOSIS — M9901 Segmental and somatic dysfunction of cervical region: Secondary | ICD-10-CM | POA: Diagnosis not present

## 2020-01-26 DIAGNOSIS — M65849 Other synovitis and tenosynovitis, unspecified hand: Secondary | ICD-10-CM | POA: Diagnosis not present

## 2020-01-26 DIAGNOSIS — M7520 Bicipital tendinitis, unspecified shoulder: Secondary | ICD-10-CM | POA: Diagnosis not present

## 2020-01-26 DIAGNOSIS — M9901 Segmental and somatic dysfunction of cervical region: Secondary | ICD-10-CM | POA: Diagnosis not present

## 2020-01-26 DIAGNOSIS — M9907 Segmental and somatic dysfunction of upper extremity: Secondary | ICD-10-CM | POA: Diagnosis not present

## 2020-02-10 ENCOUNTER — Ambulatory Visit (INDEPENDENT_AMBULATORY_CARE_PROVIDER_SITE_OTHER): Payer: BC Managed Care – PPO | Admitting: Podiatry

## 2020-02-10 ENCOUNTER — Other Ambulatory Visit: Payer: Self-pay

## 2020-02-10 ENCOUNTER — Ambulatory Visit (INDEPENDENT_AMBULATORY_CARE_PROVIDER_SITE_OTHER): Payer: BC Managed Care – PPO

## 2020-02-10 ENCOUNTER — Other Ambulatory Visit: Payer: Self-pay | Admitting: Podiatry

## 2020-02-10 DIAGNOSIS — M7752 Other enthesopathy of left foot: Secondary | ICD-10-CM | POA: Diagnosis not present

## 2020-02-10 DIAGNOSIS — M779 Enthesopathy, unspecified: Secondary | ICD-10-CM

## 2020-02-10 DIAGNOSIS — M7750 Other enthesopathy of unspecified foot: Secondary | ICD-10-CM

## 2020-02-10 DIAGNOSIS — M25572 Pain in left ankle and joints of left foot: Secondary | ICD-10-CM | POA: Diagnosis not present

## 2020-02-10 DIAGNOSIS — M722 Plantar fascial fibromatosis: Secondary | ICD-10-CM

## 2020-02-10 NOTE — Patient Instructions (Signed)
Posterior Tibial Tendinitis Rehab Ask your health care provider which exercises are safe for you. Do exercises exactly as told by your health care provider and adjust them as directed. It is normal to feel mild stretching, pulling, tightness, or discomfort as you do these exercises. Stop right away if you feel sudden pain or your pain gets worse. Do not begin these exercises until told by your health care provider. Stretching and range-of-motion exercises These exercises warm up your muscles and joints and improve the movement and flexibility in your ankle and foot. These exercises may also help to relieve pain. Standing wall calf stretch, knee straight  1. Stand with your hands against a wall. 2. Extend your left / right leg behind you, and bend your front knee slightly. If directed, place a folded washcloth under the arch of your foot for support. 3. Point the toes of your back foot slightly inward. 4. Keeping your heels on the floor and your back knee straight, shift your weight toward the wall. Do not allow your back to arch. You should feel a gentle stretch in your upper left / right calf. 5. Hold this position for __________ seconds. Repeat __________ times. Complete this exercise __________ times a day. Standing wall calf stretch, knee bent 1. Stand with your hands against a wall. 2. Extend your left / right leg behind you, and bend your front knee slightly. If directed, place a folded washcloth under the arch of your foot for support. 3. Point the toes of your back foot slightly inward. 4. Unlock your back knee so it is bent. Keep your heels on the floor. You should feel a gentle stretch deep in your lower left / right calf. 5. Hold this position for __________ seconds. Repeat __________ times. Complete this exercise __________ times a day. Strengthening exercises These exercises build strength and endurance in your ankle and foot. Endurance is the ability to use your muscles for a long  time, even after they get tired. Ankle inversion with band 1. Secure one end of a rubber exercise band or tubing to a fixed object, such as a table leg or a pole, that will stay still when the band is pulled. 2. Loop the other end of the band around the middle of your left / right foot. 3. Sit on the floor facing the object with your left / right leg extended. The band or tube should be slightly tense when your foot is relaxed. 4. Leading with your big toe, slowly bring your left / right foot and ankle inward, toward your other foot (inversion). 5. Hold this position for __________ seconds. 6. Slowly return your foot to the starting position. Repeat __________ times. Complete this exercise __________ times a day. Towel curls  1. Sit in a chair on a non-carpeted surface, and put your feet on the floor. 2. Place a towel in front of your feet. If told by your health care provider, add a __________ weight to the end of the towel. 3. Keeping your heel on the floor, put your left / right foot on the towel. 4. Pull the towel toward you by grabbing the towel with your toes and curling them under. Keep your heel on the floor while you do this. 5. Let your toes relax. 6. Grab the towel with your toes again. Keep going until the towel is completely underneath your foot. Repeat __________ times. Complete this exercise __________ times a day. Balance exercise This exercise improves or maintains your balance. Balance   is important in preventing falls. Single leg stand 1. Without wearing shoes, stand near a railing or in a doorway. You may hold on to the railing or door frame as needed for balance. 2. Stand on your left / right foot. Keep your big toe down on the floor and try to keep your arch lifted. ? If balancing in this position is too easy, try the exercise with your eyes closed or while standing on a pillow. 3. Hold this position for __________ seconds. Repeat __________ times. Complete this exercise  __________ times a day. This information is not intended to replace advice given to you by your health care provider. Make sure you discuss any questions you have with your health care provider. Document Revised: 07/21/2018 Document Reviewed: 05/27/2018 Elsevier Patient Education  2020 [employer]  

## 2020-02-16 DIAGNOSIS — M71341 Other bursal cyst, right hand: Secondary | ICD-10-CM | POA: Diagnosis not present

## 2020-02-16 DIAGNOSIS — M25511 Pain in right shoulder: Secondary | ICD-10-CM | POA: Diagnosis not present

## 2020-02-16 DIAGNOSIS — M25311 Other instability, right shoulder: Secondary | ICD-10-CM | POA: Diagnosis not present

## 2020-02-16 NOTE — Progress Notes (Signed)
Subjective: 19 year old female presents the office today for concerns of left ankle pain.  She states that she noticed pain to her left ankle on Friday.  She did not fall or twist it.  Did not change her shoes or activity level.  She was wearing sneakers when this occurred.  No increase in swelling or bruising.  Since that pain has resolved but she wants to try to make sure that the pain does not return.  Prior to this episode on Friday she was doing great having no pain she is back to normal activities and her ankle is feeling better than it did prior to surgery. Denies any systemic complaints such as fevers, chills, nausea, vomiting. No acute changes since last appointment, and no other complaints at this time.   Objective: AAO x3, NAD DP/PT pulses palpable bilaterally, CRT less than 3 seconds At this time there is no significant tenderness palpation to the left ankle.  Particular is no pain along the medial malleolus.  Subjectively she was having some discomfort along the course of the flexor, posterior tibial tendon area.  No edema, erythema.  The tendons appear to be intact.  No pain with calf compression, swelling, warmth, erythema  Assessment: Tendinitis left ankle  Plan: -All treatment options discussed with the patient including all alternatives, risks, complications.  -X-rays obtained reviewed.  No evidence of acute fracture.  Osteotomy is healed.  Hardware intact without any complicating factors. -I do think her symptoms are result of tendinitis.  Discussed going back to doing stretching, rehab exercises.  Anti-inflammatories as needed.  Ice daily.  Ankle brace as needed. -Patient encouraged to call the office with any questions, concerns, change in symptoms.   Vivi Barrack DPM

## 2020-02-28 ENCOUNTER — Ambulatory Visit: Payer: BC Managed Care – PPO | Admitting: Podiatry

## 2020-03-23 ENCOUNTER — Ambulatory Visit: Payer: BC Managed Care – PPO | Admitting: Podiatry

## 2020-03-30 ENCOUNTER — Other Ambulatory Visit: Payer: Self-pay

## 2020-03-30 ENCOUNTER — Ambulatory Visit (INDEPENDENT_AMBULATORY_CARE_PROVIDER_SITE_OTHER): Payer: BC Managed Care – PPO

## 2020-03-30 ENCOUNTER — Ambulatory Visit (INDEPENDENT_AMBULATORY_CARE_PROVIDER_SITE_OTHER): Payer: BC Managed Care – PPO | Admitting: Podiatry

## 2020-03-30 DIAGNOSIS — M25572 Pain in left ankle and joints of left foot: Secondary | ICD-10-CM

## 2020-03-30 DIAGNOSIS — M779 Enthesopathy, unspecified: Secondary | ICD-10-CM

## 2020-04-03 NOTE — Progress Notes (Signed)
Subjective: 19 year old female presents the office today for follow-up evaluation of left ankle pain.  She states that she been on feet a lot busy with work.  She has not had the pain that she has experienced prior to last appointment since I last saw her.  Some occasional discomfort to the incision itself but no other pain.  She states ankle feels much better than it did prior to surgery. Denies any systemic complaints such as fevers, chills, nausea, vomiting. No acute changes since last appointment, and no other complaints at this time.   Objective: AAO x3, NAD DP/PT pulses palpable bilaterally, CRT less than 3 seconds There is no significant tenderness palpation to the left ankle.  There is no pain with ankle joint range of motion.  There is trace edema.  No prolonged course of flexor tendons.  No erythema or tenderness.  MMT 5/5. No pain with calf compression, swelling, warmth, erythema  Assessment: Tendinitis left ankle-improved  Plan: -All treatment options discussed with the patient including all alternatives, risks, complications.  -Repeat x-rays obtained reviewed.  Hardware intact.  Ankle joint mortise intact. -On to continue with supportive shoes, inserts as well as home stretching, rehab exercises.  Symptoms have resolved and she is been on her feet quite a bit.  I recommended ointment for the scarring.  Vivi Barrack DPM

## 2020-04-05 DIAGNOSIS — M25311 Other instability, right shoulder: Secondary | ICD-10-CM | POA: Diagnosis not present

## 2020-04-05 DIAGNOSIS — M71341 Other bursal cyst, right hand: Secondary | ICD-10-CM | POA: Diagnosis not present

## 2020-04-05 DIAGNOSIS — M25511 Pain in right shoulder: Secondary | ICD-10-CM | POA: Diagnosis not present

## 2020-04-05 DIAGNOSIS — M9901 Segmental and somatic dysfunction of cervical region: Secondary | ICD-10-CM | POA: Diagnosis not present

## 2020-06-01 DIAGNOSIS — Z20822 Contact with and (suspected) exposure to covid-19: Secondary | ICD-10-CM | POA: Diagnosis not present

## 2020-06-01 DIAGNOSIS — J029 Acute pharyngitis, unspecified: Secondary | ICD-10-CM | POA: Diagnosis not present

## 2020-07-07 DIAGNOSIS — Z01419 Encounter for gynecological examination (general) (routine) without abnormal findings: Secondary | ICD-10-CM | POA: Diagnosis not present

## 2020-07-07 DIAGNOSIS — Z124 Encounter for screening for malignant neoplasm of cervix: Secondary | ICD-10-CM | POA: Diagnosis not present

## 2020-09-22 DIAGNOSIS — Z713 Dietary counseling and surveillance: Secondary | ICD-10-CM | POA: Diagnosis not present

## 2020-10-16 DIAGNOSIS — Z713 Dietary counseling and surveillance: Secondary | ICD-10-CM | POA: Diagnosis not present

## 2021-01-24 DIAGNOSIS — N898 Other specified noninflammatory disorders of vagina: Secondary | ICD-10-CM | POA: Diagnosis not present

## 2021-07-11 DIAGNOSIS — J0100 Acute maxillary sinusitis, unspecified: Secondary | ICD-10-CM | POA: Diagnosis not present

## 2021-08-16 ENCOUNTER — Ambulatory Visit: Payer: BC Managed Care – PPO

## 2021-08-16 ENCOUNTER — Ambulatory Visit (INDEPENDENT_AMBULATORY_CARE_PROVIDER_SITE_OTHER): Payer: BC Managed Care – PPO

## 2021-08-16 ENCOUNTER — Ambulatory Visit (INDEPENDENT_AMBULATORY_CARE_PROVIDER_SITE_OTHER): Payer: BC Managed Care – PPO | Admitting: Podiatry

## 2021-08-16 DIAGNOSIS — M25572 Pain in left ankle and joints of left foot: Secondary | ICD-10-CM

## 2021-08-16 DIAGNOSIS — M949 Disorder of cartilage, unspecified: Secondary | ICD-10-CM

## 2021-08-16 DIAGNOSIS — M7752 Other enthesopathy of left foot: Secondary | ICD-10-CM

## 2021-08-16 DIAGNOSIS — M899 Disorder of bone, unspecified: Secondary | ICD-10-CM | POA: Diagnosis not present

## 2021-08-21 NOTE — Progress Notes (Signed)
Subjective: ?21 year old female presents the office today for evaluation of pain to her left ankle.  Pain is intermittent but she describes pain present on the medial aspect of the ankle on the medial malleolus.  She tried anti-inflammatories which has not been helpful.  She is the pain was described as sharp at times.  No injuries or falls. ? ?Objective: ?AAO x3, NAD ?DP/PT pulses palpable bilaterally, CRT less than 3 seconds ?There is tenderness palpation over the medial malleolus of the left ankle.  There is no some edema there is no erythema.  No crepitation or restriction with ankle joint range of motion.  MMT 5/5. ?No pain with calf compression, swelling, warmth, erythema ? ?Assessment: ?Left ankle ongoing pain ? ?Plan: ?-All treatment options discussed with the patient including all alternatives, risks, complications.  ?-Repeat x-rays obtained reviewed.  3 views of the left ankle were obtained.  No evidence of acute fracture.  Hardware intact.  Scarring present on the area the osteotomy. ?-Given ongoing nature of her symptoms I do recommend CT scan to further evaluate the healing of the osteochondral lesion although it seems of her symptoms are directly on the medial malleolus.  THE osteotomy is healed and the hardware is not causing any issues.  In the meantime continue ankle brace, exercises as well. ? ?Vivi Barrack DPM ? ?

## 2021-08-30 ENCOUNTER — Ambulatory Visit
Admission: RE | Admit: 2021-08-30 | Discharge: 2021-08-30 | Disposition: A | Discharge status: Home / Self Care | Payer: BC Managed Care – PPO | Source: Ambulatory Visit | Attending: Podiatry | Admitting: Podiatry

## 2021-08-30 DIAGNOSIS — M958 Other specified acquired deformities of musculoskeletal system: Secondary | ICD-10-CM | POA: Diagnosis not present

## 2021-08-30 DIAGNOSIS — M899 Disorder of bone, unspecified: Secondary | ICD-10-CM

## 2021-08-30 DIAGNOSIS — M25572 Pain in left ankle and joints of left foot: Secondary | ICD-10-CM | POA: Diagnosis not present

## 2021-09-04 ENCOUNTER — Other Ambulatory Visit: Payer: Self-pay | Admitting: Podiatry

## 2021-09-04 DIAGNOSIS — M7752 Other enthesopathy of left foot: Secondary | ICD-10-CM

## 2021-09-20 ENCOUNTER — Ambulatory Visit (INDEPENDENT_AMBULATORY_CARE_PROVIDER_SITE_OTHER): Payer: BC Managed Care – PPO | Admitting: Podiatry

## 2021-09-20 DIAGNOSIS — M949 Disorder of cartilage, unspecified: Secondary | ICD-10-CM

## 2021-09-20 DIAGNOSIS — M7752 Other enthesopathy of left foot: Secondary | ICD-10-CM

## 2021-09-20 DIAGNOSIS — M899 Disorder of bone, unspecified: Secondary | ICD-10-CM

## 2021-09-20 NOTE — Patient Instructions (Signed)

## 2021-09-22 NOTE — Progress Notes (Signed)
Subjective: 21 year old female presents the office today for follow-up evaluation of left ankle discomfort.  She gets pain directly along the medial malleolus.  I contacted her with the CT scan results and she presents today for follow-up.  She will try a steroid injection.  Objective: AAO x3, NAD DP/PT pulses palpable bilaterally, CRT less than 3 seconds The tenderness is directly along the medial malleolus along the incision site.  Scarring is present.  There is no significant edema there is no erythema.  No significant discomfort.  Posterior tibial and flexor tendons.  There is no pain to the ankle joint itself at this time there is no pain or crepitation with ankle joint range of motion. No pain with calf compression, swelling, warmth, erythema  Assessment: 21 year old female with left ankle discomfort possibly from scar versus hardware   Plan: -All treatment options discussed with the patient including all alternatives, risks, complications.  -Again reviewed the CT scan with her.  At this time the pain does not seem to be at all of the osteochondral lesion.  I discussed the possible hardware removal however she was to hold off on that.  She was pretreated with a steroid injection.  I cleaned the skin with alcohol and then a mixture of 0.5 cc of Marcaine plain, 0.5 cc of dexamethasone phosphate was infiltrated subdermally out of the scar on the medial malleolus.  Postinjection care discussed.  She tolerated well. -Patient encouraged to call the office with any questions, concerns, change in symptoms.   Vivi Barrack DPM

## 2021-10-04 DIAGNOSIS — Z124 Encounter for screening for malignant neoplasm of cervix: Secondary | ICD-10-CM | POA: Diagnosis not present

## 2021-10-04 DIAGNOSIS — Z01419 Encounter for gynecological examination (general) (routine) without abnormal findings: Secondary | ICD-10-CM | POA: Diagnosis not present

## 2021-12-04 DIAGNOSIS — M9902 Segmental and somatic dysfunction of thoracic region: Secondary | ICD-10-CM | POA: Diagnosis not present

## 2021-12-04 DIAGNOSIS — M9901 Segmental and somatic dysfunction of cervical region: Secondary | ICD-10-CM | POA: Diagnosis not present

## 2021-12-04 DIAGNOSIS — M9903 Segmental and somatic dysfunction of lumbar region: Secondary | ICD-10-CM | POA: Diagnosis not present

## 2021-12-04 DIAGNOSIS — M9905 Segmental and somatic dysfunction of pelvic region: Secondary | ICD-10-CM | POA: Diagnosis not present

## 2021-12-06 DIAGNOSIS — M9905 Segmental and somatic dysfunction of pelvic region: Secondary | ICD-10-CM | POA: Diagnosis not present

## 2021-12-06 DIAGNOSIS — M9903 Segmental and somatic dysfunction of lumbar region: Secondary | ICD-10-CM | POA: Diagnosis not present

## 2021-12-06 DIAGNOSIS — M9902 Segmental and somatic dysfunction of thoracic region: Secondary | ICD-10-CM | POA: Diagnosis not present

## 2021-12-06 DIAGNOSIS — M9901 Segmental and somatic dysfunction of cervical region: Secondary | ICD-10-CM | POA: Diagnosis not present

## 2021-12-11 DIAGNOSIS — M9905 Segmental and somatic dysfunction of pelvic region: Secondary | ICD-10-CM | POA: Diagnosis not present

## 2021-12-11 DIAGNOSIS — M9901 Segmental and somatic dysfunction of cervical region: Secondary | ICD-10-CM | POA: Diagnosis not present

## 2021-12-11 DIAGNOSIS — M9903 Segmental and somatic dysfunction of lumbar region: Secondary | ICD-10-CM | POA: Diagnosis not present

## 2021-12-11 DIAGNOSIS — M9902 Segmental and somatic dysfunction of thoracic region: Secondary | ICD-10-CM | POA: Diagnosis not present

## 2021-12-13 DIAGNOSIS — M9903 Segmental and somatic dysfunction of lumbar region: Secondary | ICD-10-CM | POA: Diagnosis not present

## 2021-12-13 DIAGNOSIS — M9905 Segmental and somatic dysfunction of pelvic region: Secondary | ICD-10-CM | POA: Diagnosis not present

## 2021-12-13 DIAGNOSIS — M9901 Segmental and somatic dysfunction of cervical region: Secondary | ICD-10-CM | POA: Diagnosis not present

## 2021-12-13 DIAGNOSIS — M9902 Segmental and somatic dysfunction of thoracic region: Secondary | ICD-10-CM | POA: Diagnosis not present

## 2021-12-18 DIAGNOSIS — M9905 Segmental and somatic dysfunction of pelvic region: Secondary | ICD-10-CM | POA: Diagnosis not present

## 2021-12-18 DIAGNOSIS — M9903 Segmental and somatic dysfunction of lumbar region: Secondary | ICD-10-CM | POA: Diagnosis not present

## 2021-12-18 DIAGNOSIS — M9901 Segmental and somatic dysfunction of cervical region: Secondary | ICD-10-CM | POA: Diagnosis not present

## 2021-12-18 DIAGNOSIS — M9902 Segmental and somatic dysfunction of thoracic region: Secondary | ICD-10-CM | POA: Diagnosis not present

## 2021-12-20 DIAGNOSIS — M9901 Segmental and somatic dysfunction of cervical region: Secondary | ICD-10-CM | POA: Diagnosis not present

## 2021-12-20 DIAGNOSIS — M9902 Segmental and somatic dysfunction of thoracic region: Secondary | ICD-10-CM | POA: Diagnosis not present

## 2021-12-20 DIAGNOSIS — M9903 Segmental and somatic dysfunction of lumbar region: Secondary | ICD-10-CM | POA: Diagnosis not present

## 2021-12-20 DIAGNOSIS — M9905 Segmental and somatic dysfunction of pelvic region: Secondary | ICD-10-CM | POA: Diagnosis not present

## 2021-12-25 DIAGNOSIS — M25561 Pain in right knee: Secondary | ICD-10-CM | POA: Diagnosis not present

## 2021-12-25 DIAGNOSIS — M7051 Other bursitis of knee, right knee: Secondary | ICD-10-CM | POA: Diagnosis not present

## 2021-12-27 DIAGNOSIS — M9903 Segmental and somatic dysfunction of lumbar region: Secondary | ICD-10-CM | POA: Diagnosis not present

## 2021-12-27 DIAGNOSIS — M9905 Segmental and somatic dysfunction of pelvic region: Secondary | ICD-10-CM | POA: Diagnosis not present

## 2021-12-27 DIAGNOSIS — M9901 Segmental and somatic dysfunction of cervical region: Secondary | ICD-10-CM | POA: Diagnosis not present

## 2021-12-27 DIAGNOSIS — M9902 Segmental and somatic dysfunction of thoracic region: Secondary | ICD-10-CM | POA: Diagnosis not present

## 2022-01-01 DIAGNOSIS — M9901 Segmental and somatic dysfunction of cervical region: Secondary | ICD-10-CM | POA: Diagnosis not present

## 2022-01-01 DIAGNOSIS — M9902 Segmental and somatic dysfunction of thoracic region: Secondary | ICD-10-CM | POA: Diagnosis not present

## 2022-01-01 DIAGNOSIS — M9905 Segmental and somatic dysfunction of pelvic region: Secondary | ICD-10-CM | POA: Diagnosis not present

## 2022-01-01 DIAGNOSIS — M9903 Segmental and somatic dysfunction of lumbar region: Secondary | ICD-10-CM | POA: Diagnosis not present

## 2022-01-08 DIAGNOSIS — M9905 Segmental and somatic dysfunction of pelvic region: Secondary | ICD-10-CM | POA: Diagnosis not present

## 2022-01-08 DIAGNOSIS — M9903 Segmental and somatic dysfunction of lumbar region: Secondary | ICD-10-CM | POA: Diagnosis not present

## 2022-01-08 DIAGNOSIS — M9902 Segmental and somatic dysfunction of thoracic region: Secondary | ICD-10-CM | POA: Diagnosis not present

## 2022-01-08 DIAGNOSIS — M9901 Segmental and somatic dysfunction of cervical region: Secondary | ICD-10-CM | POA: Diagnosis not present

## 2022-01-15 DIAGNOSIS — M9901 Segmental and somatic dysfunction of cervical region: Secondary | ICD-10-CM | POA: Diagnosis not present

## 2022-01-15 DIAGNOSIS — M9903 Segmental and somatic dysfunction of lumbar region: Secondary | ICD-10-CM | POA: Diagnosis not present

## 2022-01-15 DIAGNOSIS — M9902 Segmental and somatic dysfunction of thoracic region: Secondary | ICD-10-CM | POA: Diagnosis not present

## 2022-01-15 DIAGNOSIS — M9905 Segmental and somatic dysfunction of pelvic region: Secondary | ICD-10-CM | POA: Diagnosis not present

## 2022-01-31 DIAGNOSIS — M9903 Segmental and somatic dysfunction of lumbar region: Secondary | ICD-10-CM | POA: Diagnosis not present

## 2022-01-31 DIAGNOSIS — M9905 Segmental and somatic dysfunction of pelvic region: Secondary | ICD-10-CM | POA: Diagnosis not present

## 2022-01-31 DIAGNOSIS — M9901 Segmental and somatic dysfunction of cervical region: Secondary | ICD-10-CM | POA: Diagnosis not present

## 2022-01-31 DIAGNOSIS — M9902 Segmental and somatic dysfunction of thoracic region: Secondary | ICD-10-CM | POA: Diagnosis not present

## 2022-02-07 DIAGNOSIS — D580 Hereditary spherocytosis: Secondary | ICD-10-CM | POA: Diagnosis not present

## 2022-02-14 DIAGNOSIS — M9902 Segmental and somatic dysfunction of thoracic region: Secondary | ICD-10-CM | POA: Diagnosis not present

## 2022-02-14 DIAGNOSIS — M9901 Segmental and somatic dysfunction of cervical region: Secondary | ICD-10-CM | POA: Diagnosis not present

## 2022-02-14 DIAGNOSIS — M9905 Segmental and somatic dysfunction of pelvic region: Secondary | ICD-10-CM | POA: Diagnosis not present

## 2022-02-14 DIAGNOSIS — M9903 Segmental and somatic dysfunction of lumbar region: Secondary | ICD-10-CM | POA: Diagnosis not present

## 2022-02-19 ENCOUNTER — Other Ambulatory Visit: Payer: Self-pay | Admitting: Sports Medicine

## 2022-02-19 ENCOUNTER — Ambulatory Visit
Admission: RE | Admit: 2022-02-19 | Discharge: 2022-02-19 | Disposition: A | Discharge status: Home / Self Care | Payer: BC Managed Care – PPO | Source: Ambulatory Visit | Attending: Sports Medicine | Admitting: Sports Medicine

## 2022-02-19 DIAGNOSIS — M7051 Other bursitis of knee, right knee: Secondary | ICD-10-CM | POA: Diagnosis not present

## 2022-02-19 DIAGNOSIS — M25561 Pain in right knee: Secondary | ICD-10-CM | POA: Diagnosis not present

## 2022-02-19 DIAGNOSIS — M9906 Segmental and somatic dysfunction of lower extremity: Secondary | ICD-10-CM | POA: Diagnosis not present

## 2022-02-26 DIAGNOSIS — M9901 Segmental and somatic dysfunction of cervical region: Secondary | ICD-10-CM | POA: Diagnosis not present

## 2022-02-26 DIAGNOSIS — M9902 Segmental and somatic dysfunction of thoracic region: Secondary | ICD-10-CM | POA: Diagnosis not present

## 2022-02-26 DIAGNOSIS — M9905 Segmental and somatic dysfunction of pelvic region: Secondary | ICD-10-CM | POA: Diagnosis not present

## 2022-02-26 DIAGNOSIS — M9903 Segmental and somatic dysfunction of lumbar region: Secondary | ICD-10-CM | POA: Diagnosis not present

## 2022-03-05 DIAGNOSIS — Z91018 Allergy to other foods: Secondary | ICD-10-CM | POA: Diagnosis not present

## 2022-03-05 DIAGNOSIS — J309 Allergic rhinitis, unspecified: Secondary | ICD-10-CM | POA: Diagnosis not present

## 2022-03-12 DIAGNOSIS — M9903 Segmental and somatic dysfunction of lumbar region: Secondary | ICD-10-CM | POA: Diagnosis not present

## 2022-03-12 DIAGNOSIS — M9902 Segmental and somatic dysfunction of thoracic region: Secondary | ICD-10-CM | POA: Diagnosis not present

## 2022-03-12 DIAGNOSIS — M9905 Segmental and somatic dysfunction of pelvic region: Secondary | ICD-10-CM | POA: Diagnosis not present

## 2022-03-12 DIAGNOSIS — M9901 Segmental and somatic dysfunction of cervical region: Secondary | ICD-10-CM | POA: Diagnosis not present

## 2022-03-15 ENCOUNTER — Other Ambulatory Visit: Payer: Self-pay

## 2022-03-15 ENCOUNTER — Inpatient Hospital Stay: Payer: BC Managed Care – PPO

## 2022-03-15 ENCOUNTER — Inpatient Hospital Stay: Payer: BC Managed Care – PPO | Attending: Oncology | Admitting: Oncology

## 2022-03-15 VITALS — BP 108/75 | HR 76 | Temp 98.6°F | Resp 15 | Wt 134.9 lb

## 2022-03-15 DIAGNOSIS — Z7189 Other specified counseling: Secondary | ICD-10-CM | POA: Diagnosis not present

## 2022-03-15 DIAGNOSIS — Z832 Family history of diseases of the blood and blood-forming organs and certain disorders involving the immune mechanism: Secondary | ICD-10-CM | POA: Diagnosis not present

## 2022-03-15 DIAGNOSIS — D580 Hereditary spherocytosis: Secondary | ICD-10-CM | POA: Insufficient documentation

## 2022-03-15 LAB — CMP (CANCER CENTER ONLY)
ALT: 12 U/L (ref 0–44)
AST: 17 U/L (ref 15–41)
Albumin: 5 g/dL (ref 3.5–5.0)
Alkaline Phosphatase: 39 U/L (ref 38–126)
Anion gap: 4 — ABNORMAL LOW (ref 5–15)
BUN: 13 mg/dL (ref 6–20)
CO2: 30 mmol/L (ref 22–32)
Calcium: 10.1 mg/dL (ref 8.9–10.3)
Chloride: 105 mmol/L (ref 98–111)
Creatinine: 0.73 mg/dL (ref 0.44–1.00)
GFR, Estimated: >60 mL/min (ref >60)
Glucose, Bld: 85 mg/dL (ref 70–99)
Potassium: 3.5 mmol/L (ref 3.5–5.1)
Sodium: 139 mmol/L (ref 135–145)
Total Bilirubin: 1.5 mg/dL — ABNORMAL HIGH (ref 0.3–1.2)
Total Protein: 7.9 g/dL (ref 6.5–8.1)

## 2022-03-15 LAB — CBC WITH DIFFERENTIAL (CANCER CENTER ONLY)
Abs Immature Granulocytes: 0.01 10*3/uL (ref 0.00–0.07)
Basophils Absolute: 0 10*3/uL (ref 0.0–0.1)
Basophils Relative: 0 %
Eosinophils Absolute: 0 10*3/uL (ref 0.0–0.5)
Eosinophils Relative: 1 %
HCT: 34.2 % — ABNORMAL LOW (ref 36.0–46.0)
Hemoglobin: 12.9 g/dL (ref 12.0–15.0)
Immature Granulocytes: 0 %
Lymphocytes Relative: 20 %
Lymphs Abs: 1.2 10*3/uL (ref 0.7–4.0)
MCH: 33.6 pg (ref 26.0–34.0)
MCHC: 37.7 g/dL — ABNORMAL HIGH (ref 30.0–36.0)
MCV: 89.1 fL (ref 80.0–100.0)
Monocytes Absolute: 0.3 10*3/uL (ref 0.1–1.0)
Monocytes Relative: 4 %
Neutro Abs: 4.6 10*3/uL (ref 1.7–7.7)
Neutrophils Relative %: 75 %
Platelet Count: 264 10*3/uL (ref 150–400)
RBC: 3.84 MIL/uL — ABNORMAL LOW (ref 3.87–5.11)
RDW: 15.5 % (ref 11.5–15.5)
Smear Review: NORMAL
WBC Count: 6.2 10*3/uL (ref 4.0–10.5)
nRBC: 0 % (ref 0.0–0.2)

## 2022-03-15 LAB — RETICULOCYTES
Immature Retic Fract: 7.9 % (ref 2.3–15.9)
RBC.: 3.77 MIL/uL — ABNORMAL LOW (ref 3.87–5.11)
Retic Count, Absolute: 160.2 10*3/uL (ref 19.0–186.0)
Retic Ct Pct: 4.3 % — ABNORMAL HIGH (ref 0.4–3.1)

## 2022-03-15 LAB — TECHNOLOGIST SMEAR REVIEW: Plt Morphology: NORMAL

## 2022-03-15 LAB — FERRITIN: Ferritin: 212 ng/mL (ref 11–307)

## 2022-03-15 LAB — VITAMIN B12: Vitamin B-12: 253 pg/mL (ref 180–914)

## 2022-03-15 LAB — LACTATE DEHYDROGENASE: LDH: 178 U/L (ref 98–192)

## 2022-03-15 LAB — FOLATE: Folate: 11.4 ng/mL (ref >5.9)

## 2022-03-15 NOTE — Progress Notes (Unsigned)
[facility] Initial Visit:  Patient Care Team: Patient, No Pcp Per as PCP - General (General Practice)  CHIEF COMPLAINTS/PURPOSE OF CONSULTATION:  Oncology History   No history exists.    HISTORY OF PRESENTING ILLNESS: Frances Bowman 21 y.o. female is here because of hereditary spherocytosis Medical history notable for recurrent sinusitis, renal cyst, chronic fatigue  March 15, 2022: [facility] Review of patient's history is notable for diagnosis of hereditary spherocytosis at age 26 when she presented with anemia jaundice and fatigue.  She was hospitalized overnight did not require transfusion with packed red blood cells.  She also does have a history of having mononucleosis.  Per review of records paternal side of the family has hereditary spherocytosis.  Patient's father and siblings have been diagnosed with this.  She has not had a history of aplastic crisis.  She was followed by pediatric hematology and then transition to the adult clinic at [facility] from Sep 03, 2016 showed a WBC of 5.3 hemoglobin 12.4 MCV 88.3 platelet count 249; 63 segs 29 lymphs 5 monos 2 eos 1 basophil.  Reticulocyte count 3.2% Hepatic function panel at that time notable for T. bili 0.2 alk phos 41 AST 13 ALT 8  Has not seen a hematologist in several years.   Not taking folic acid Menses monthly; lasts 4 days.  Light.    Social:  Teacher, adult education for dog boarding.  Recently graduated [employer] with degree in biology.  Interested in becoming a Vet.  Tobacco none.  EtOH occasinally  [facility] Father alive 77 Hereditary spherocytosis.  Underwent cholecystectomy and splenectomy.    Review of Systems  Constitutional:  Positive for fatigue. Negative for appetite change, chills, fever and unexpected weight change.       Has experienced some fatigue  HENT:   Negative for mouth sores, nosebleeds, sore throat, trouble swallowing and voice change.   Eyes:  Negative for eye  problems and icterus.       Vision changes:  None  Respiratory:  Negative for chest tightness, cough, hemoptysis, shortness of breath and wheezing.        PND:  none Orthopnea:  none DOE:    Cardiovascular:  Negative for chest pain, leg swelling and palpitations.       PND:  none Orthopnea:  none  Gastrointestinal:  Negative for abdominal pain, blood in stool, constipation, diarrhea, nausea and vomiting.  Endocrine:       Cold intolerance:  none Heat intolerance:  none  Genitourinary:  Negative for bladder incontinence, difficulty urinating, dysuria, frequency, hematuria and nocturia.        No dark urine  Musculoskeletal:  Negative for arthralgias, back pain, gait problem, myalgias, neck pain and neck stiffness.  Skin:  Negative for itching, rash and wound.  Neurological:  Positive for numbness. Negative for dizziness, extremity weakness, gait problem, headaches, light-headedness, seizures and speech difficulty.       Has some numbness in feet but has nerve injury  Hematological:  Negative for adenopathy. Does not bruise/bleed easily.  Psychiatric/Behavioral:  Negative for sleep disturbance and suicidal ideas. The patient is not nervous/anxious.     MEDICAL HISTORY: No past medical history on file.  SURGICAL HISTORY: Past Surgical History:  Procedure Laterality Date  . ANKLE SURGERY Left     SOCIAL HISTORY: Social History   Socioeconomic History  . Marital status: Single    Spouse name: Not on file  . Number of children: Not on  file  . Years of education: Not on file  . Highest education level: Not on file  Occupational History  . Not on file  Tobacco Use  . Smoking status: Never  . Smokeless tobacco: Never  Substance and Sexual Activity  . Alcohol use: Yes  . Drug use: Never  . Sexual activity: Not on file  Other Topics Concern  . Not on file  Social History Narrative  . Not on file   Social Determinants of Health   Financial Resource Strain: Not on file   Food Insecurity: Not on file  Transportation Needs: Not on file  Physical Activity: Not on file  Stress: Not on file  Social Connections: Not on file  Intimate Partner Violence: Not on file    FAMILY HISTORY Family History  Problem Relation Age of Onset  . Hypertension Father     ALLERGIES:  has No Known Allergies.  MEDICATIONS:  Current Outpatient Medications  Medication Sig Dispense Refill  . tretinoin (RETIN-A) 0.025 % cream      No current facility-administered medications for this visit.    PHYSICAL EXAMINATION:  ECOG PERFORMANCE STATUS: {CHL ONC ECOG [phone]}   There were no vitals filed for this visit.  There were no vitals filed for this visit.   Physical Exam Vitals and nursing note reviewed.  Constitutional:      General: She is not in acute distress.    Appearance: Normal appearance. She is normal weight. She is not ill-appearing, toxic-appearing or diaphoretic.     Comments: Here alone.    HENT:     Head: Normocephalic and atraumatic.     Right Ear: External ear normal.     Left Ear: External ear normal.     Nose: Nose normal. No congestion or rhinorrhea.  Eyes:     General: No scleral icterus.    Extraocular Movements: Extraocular movements intact.     Conjunctiva/sclera: Conjunctivae normal.     Pupils: Pupils are equal, round, and reactive to light.  Cardiovascular:     Rate and Rhythm: Normal rate.     Heart sounds: No murmur heard.    No friction rub. No gallop.  Pulmonary:     Effort: Pulmonary effort is normal. No respiratory distress.     Breath sounds: Normal breath sounds. No stridor. No wheezing or rhonchi.  Abdominal:     General: Abdomen is flat. Bowel sounds are normal.     Palpations: Abdomen is soft.     Comments: No HSM  Musculoskeletal:        General: No swelling, tenderness or deformity.     Cervical back: Normal range of motion and neck supple. No rigidity or tenderness.     Right lower leg: No edema.     Left  lower leg: No edema.  Lymphadenopathy:     Head:     Right side of head: No submental, submandibular, tonsillar, preauricular, posterior auricular or occipital adenopathy.     Left side of head: No submental, submandibular, tonsillar, preauricular, posterior auricular or occipital adenopathy.     Cervical: No cervical adenopathy.     Right cervical: No superficial, deep or posterior cervical adenopathy.    Left cervical: No superficial, deep or posterior cervical adenopathy.     Upper Body:     Right upper body: No supraclavicular, axillary, pectoral or epitrochlear adenopathy.     Left upper body: No supraclavicular, axillary, pectoral or epitrochlear adenopathy.  Skin:    General: Skin is warm.  Coloration: Skin is not jaundiced or pale.     Findings: No erythema.  Neurological:     General: No focal deficit present.     Mental Status: She is alert and oriented to person, place, and time.     Cranial Nerves: No cranial nerve deficit.     Motor: No weakness.     Coordination: Coordination normal.     Gait: Gait normal.  Psychiatric:        Mood and Affect: Mood normal.        Behavior: Behavior normal.        Thought Content: Thought content normal.        Judgment: Judgment normal.    LABORATORY DATA: I have personally reviewed the data as listed:  No visits with results within 1 Month(s) from this visit.  Latest known visit with results is:  No results found for any previous visit.    RADIOGRAPHIC STUDIES: I have personally reviewed the radiological images as listed and agree with the findings in the report  No results found.  ASSESSMENT/PLAN  Hereditary spherocytosis (HS) Heterogeneous group of disorders characterized by hemolytic anemia, splenomegaly due to the presence of spherical-shaped erythrocytes (spherocytes).  Patients experience anemia, jaundice, and splenomegaly, with variable severity. May also experience hepatomegaly.  Common complications include  cholelithiasis, hemolytic episodes, and aplastic crises.  Results from abnormalities of membrane proteins:  spectrin, ankyrin, actin, band 4.1, and band 3.    Mild (20-30%) (Hgb 10 to 11.5 g/dL in females or 10 to 13.5 g/dL in males):  Very mild anemia or sometimes have no symptoms Inheritance:   Autosomal dominant (75 %):  Inherited from one affected parent or from new mutations in the gene. Exacerbations of anemia may also occur in certain settings:  ?Infections - Infections that impair erythropoiesis in the bone marrow and thus diminish the capacity to compensate for chronic hemolysis may lead to a period of aplasia. A commonly cited cause of transient aplastic crisis is parvovirus B19 infection; other viral or bacterial infections may also cause transient aplasia. This is because individuals with chronic hemolysis are highly dependent on the accelerated production of new RBCs by the bone marrow, and they can experience a rapid drop in hemoglobin level when the bone marrow is unable to compensate for hemolysis. Infections may also exacerbate hemolysis, causing worsening of anemia even with increased reticulocyte count and parallel increase of bilirubin and lactate dehydrogenase (LDH).   Nutrient deficiencies - Individuals who become deficient in folate, vitamin B12, or iron may be unable to produce sufficient RBCs to compensate for those destroyed by hemolysis Iron deficiency is rare in hereditary spherocytosis  Pregnancy - Anemia may worsen during pregnancy as the RBC mass and plasma volume expand to meet the physiologic needs of the pregnancy. Attention to folic acid supplementation is especially important during pregnancy to avoid increased risk of neural tube defects in the fetus and superimposed megaloblastic anemia for the mother   Cancer Staging  No matching staging information was found for the patient.   No problem-specific Assessment & Plan notes found for this encounter.   No  orders of the defined types were placed in this encounter.   All questions were answered. The patient knows to call the clinic with any problems, questions or concerns.  This note was electronically signed.    Barbee Cough, MD  03/15/2022 8:41 AM

## 2022-03-15 NOTE — Patient Instructions (Signed)
Please begin folic acid 2 mg by mouth daily

## 2022-03-18 ENCOUNTER — Telehealth: Payer: Self-pay | Admitting: Oncology

## 2022-03-18 NOTE — Telephone Encounter (Signed)
Spoke with patient confirming upcoming appointments  

## 2022-03-19 DIAGNOSIS — M25561 Pain in right knee: Secondary | ICD-10-CM | POA: Diagnosis not present

## 2022-03-19 DIAGNOSIS — M9905 Segmental and somatic dysfunction of pelvic region: Secondary | ICD-10-CM | POA: Diagnosis not present

## 2022-03-19 DIAGNOSIS — M9906 Segmental and somatic dysfunction of lower extremity: Secondary | ICD-10-CM | POA: Diagnosis not present

## 2022-03-19 DIAGNOSIS — M9903 Segmental and somatic dysfunction of lumbar region: Secondary | ICD-10-CM | POA: Diagnosis not present

## 2022-03-20 DIAGNOSIS — Z7189 Other specified counseling: Secondary | ICD-10-CM | POA: Insufficient documentation

## 2022-04-03 DIAGNOSIS — M9901 Segmental and somatic dysfunction of cervical region: Secondary | ICD-10-CM | POA: Diagnosis not present

## 2022-04-03 DIAGNOSIS — M9905 Segmental and somatic dysfunction of pelvic region: Secondary | ICD-10-CM | POA: Diagnosis not present

## 2022-04-03 DIAGNOSIS — M9902 Segmental and somatic dysfunction of thoracic region: Secondary | ICD-10-CM | POA: Diagnosis not present

## 2022-04-03 DIAGNOSIS — M9903 Segmental and somatic dysfunction of lumbar region: Secondary | ICD-10-CM | POA: Diagnosis not present

## 2022-04-05 DIAGNOSIS — R519 Headache, unspecified: Secondary | ICD-10-CM | POA: Diagnosis not present

## 2022-04-05 DIAGNOSIS — R509 Fever, unspecified: Secondary | ICD-10-CM | POA: Diagnosis not present

## 2022-05-01 DIAGNOSIS — M7051 Other bursitis of knee, right knee: Secondary | ICD-10-CM | POA: Diagnosis not present

## 2022-05-01 DIAGNOSIS — M25561 Pain in right knee: Secondary | ICD-10-CM | POA: Diagnosis not present

## 2022-05-01 DIAGNOSIS — M9906 Segmental and somatic dysfunction of lower extremity: Secondary | ICD-10-CM | POA: Diagnosis not present

## 2022-05-02 DIAGNOSIS — M9902 Segmental and somatic dysfunction of thoracic region: Secondary | ICD-10-CM | POA: Diagnosis not present

## 2022-05-02 DIAGNOSIS — M9905 Segmental and somatic dysfunction of pelvic region: Secondary | ICD-10-CM | POA: Diagnosis not present

## 2022-05-02 DIAGNOSIS — M9901 Segmental and somatic dysfunction of cervical region: Secondary | ICD-10-CM | POA: Diagnosis not present

## 2022-05-02 DIAGNOSIS — M9903 Segmental and somatic dysfunction of lumbar region: Secondary | ICD-10-CM | POA: Diagnosis not present

## 2022-05-30 DIAGNOSIS — M9902 Segmental and somatic dysfunction of thoracic region: Secondary | ICD-10-CM | POA: Diagnosis not present

## 2022-05-30 DIAGNOSIS — M9903 Segmental and somatic dysfunction of lumbar region: Secondary | ICD-10-CM | POA: Diagnosis not present

## 2022-05-30 DIAGNOSIS — M9901 Segmental and somatic dysfunction of cervical region: Secondary | ICD-10-CM | POA: Diagnosis not present

## 2022-05-30 DIAGNOSIS — M9905 Segmental and somatic dysfunction of pelvic region: Secondary | ICD-10-CM | POA: Diagnosis not present

## 2022-06-25 DIAGNOSIS — M9905 Segmental and somatic dysfunction of pelvic region: Secondary | ICD-10-CM | POA: Diagnosis not present

## 2022-06-25 DIAGNOSIS — M9903 Segmental and somatic dysfunction of lumbar region: Secondary | ICD-10-CM | POA: Diagnosis not present

## 2022-06-25 DIAGNOSIS — M9901 Segmental and somatic dysfunction of cervical region: Secondary | ICD-10-CM | POA: Diagnosis not present

## 2022-06-25 DIAGNOSIS — M9902 Segmental and somatic dysfunction of thoracic region: Secondary | ICD-10-CM | POA: Diagnosis not present

## 2022-06-28 ENCOUNTER — Other Ambulatory Visit: Payer: Self-pay | Admitting: Nurse Practitioner

## 2022-06-28 DIAGNOSIS — N6312 Unspecified lump in the right breast, upper inner quadrant: Secondary | ICD-10-CM | POA: Diagnosis not present

## 2022-06-28 DIAGNOSIS — N6310 Unspecified lump in the right breast, unspecified quadrant: Secondary | ICD-10-CM

## 2022-07-05 ENCOUNTER — Other Ambulatory Visit: Payer: Self-pay | Admitting: Nurse Practitioner

## 2022-07-05 ENCOUNTER — Ambulatory Visit
Admission: RE | Admit: 2022-07-05 | Discharge: 2022-07-05 | Disposition: A | Discharge status: Home / Self Care | Payer: BC Managed Care – PPO | Source: Ambulatory Visit | Attending: Nurse Practitioner | Admitting: Nurse Practitioner

## 2022-07-05 DIAGNOSIS — N6311 Unspecified lump in the right breast, upper outer quadrant: Secondary | ICD-10-CM | POA: Diagnosis not present

## 2022-07-05 DIAGNOSIS — N6310 Unspecified lump in the right breast, unspecified quadrant: Secondary | ICD-10-CM

## 2022-07-23 DIAGNOSIS — M9901 Segmental and somatic dysfunction of cervical region: Secondary | ICD-10-CM | POA: Diagnosis not present

## 2022-07-23 DIAGNOSIS — M9903 Segmental and somatic dysfunction of lumbar region: Secondary | ICD-10-CM | POA: Diagnosis not present

## 2022-07-23 DIAGNOSIS — M9902 Segmental and somatic dysfunction of thoracic region: Secondary | ICD-10-CM | POA: Diagnosis not present

## 2022-07-23 DIAGNOSIS — M9905 Segmental and somatic dysfunction of pelvic region: Secondary | ICD-10-CM | POA: Diagnosis not present

## 2022-07-25 ENCOUNTER — Telehealth: Payer: Self-pay | Admitting: Oncology

## 2022-07-25 NOTE — Telephone Encounter (Signed)
Called patient per 6/14 PAL to reschedule patient. Left voicemail with new appointment information and contact details if needing to reschedule.

## 2022-09-12 ENCOUNTER — Other Ambulatory Visit: Payer: Self-pay

## 2022-09-12 ENCOUNTER — Other Ambulatory Visit: Payer: Self-pay | Admitting: Oncology

## 2022-09-12 DIAGNOSIS — M9902 Segmental and somatic dysfunction of thoracic region: Secondary | ICD-10-CM | POA: Diagnosis not present

## 2022-09-12 DIAGNOSIS — M9905 Segmental and somatic dysfunction of pelvic region: Secondary | ICD-10-CM | POA: Diagnosis not present

## 2022-09-12 DIAGNOSIS — D580 Hereditary spherocytosis: Secondary | ICD-10-CM

## 2022-09-12 DIAGNOSIS — M9903 Segmental and somatic dysfunction of lumbar region: Secondary | ICD-10-CM | POA: Diagnosis not present

## 2022-09-12 NOTE — Progress Notes (Signed)
[facility] Cancer Follow up Visit:  Patient Care Team: Patient, No Pcp Per as PCP - General (General Practice)  CHIEF COMPLAINTS/PURPOSE OF CONSULTATION:  HISTORY OF PRESENTING ILLNESS: Frances Bowman 22 y.o. female is here because of hereditary spherocytosis Medical history notable for recurrent sinusitis, renal cyst, chronic fatigue  March 15, 2022: [facility] Hematology Consult Review of patient's history is notable for diagnosis of hereditary spherocytosis at age 40 when she presented with anemia jaundice and fatigue.  She was hospitalized overnight did not require transfusion with packed red blood cells.  She also does have a history of having mononucleosis.  Per review of records paternal side of the family has hereditary spherocytosis.  Patient's father and siblings have been diagnosed with this.  She has not had a history of aplastic crisis.  She was followed by pediatric hematology and then transition to the adult clinic at [facility] from Sep 03, 2016 showed a WBC of 5.3 hemoglobin 12.4 MCV 88.3 platelet count 249; 63 segs 29 lymphs 5 monos 2 eos 1 basophil.  Reticulocyte count 3.2% Hepatic function panel at that time notable for T. bili 0.2 alk phos 41 AST 13 ALT 8  Has not seen a hematologist in several years.   Not taking folic acid Menses monthly; lasts 4 days.  Light.    Social:  Advice worker for dog boarding.  Recently graduated [employer] with degree in biology.  Interested in becoming a Vet.  Tobacco none.  EtOH occasinally  [facility] Father alive 32 Hereditary spherocytosis.  Underwent cholecystectomy and splenectomy.    WBC 6.2 hemoglobin 12.9 MCV 89.1 MCHC 37.7 platelet count 264; 75 segs 20 lymphs 4 monos 1 EO.  Smear demonstrates marked spherocytosis. Ferritin 212 folate 11.4 B12 253.  LDH 178.  CMP notable for T. bili 1.5  September 13 2022:  Scheduled follow up for hereditary spherocytosis.  Not taking folic acid.  No abdominal pain or jaundice Hgb  11.6 Folate 8.9 B12 227 LDH 186 Retic 4.1  Review of Systems  Constitutional:  Positive for fatigue. Negative for appetite change, chills, fever and unexpected weight change.       Has experienced some fatigue  HENT:   Negative for mouth sores, nosebleeds, sore throat, trouble swallowing and voice change.   Eyes:  Negative for eye problems and icterus.       Vision changes:  None  Respiratory:  Negative for chest tightness, cough, hemoptysis, shortness of breath and wheezing.        PND:  none Orthopnea:  none DOE:    Cardiovascular:  Negative for chest pain, leg swelling and palpitations.       PND:  none Orthopnea:  none  Gastrointestinal:  Negative for abdominal pain, blood in stool, constipation, diarrhea, nausea and vomiting.  Endocrine:       Cold intolerance:  none Heat intolerance:  none  Genitourinary:  Negative for bladder incontinence, difficulty urinating, dysuria, frequency, hematuria and nocturia.        No dark urine  Musculoskeletal:  Negative for arthralgias, back pain, gait problem, myalgias, neck pain and neck stiffness.  Skin:  Negative for itching, rash and wound.  Neurological:  Positive for numbness. Negative for dizziness, extremity weakness, gait problem, headaches, light-headedness, seizures and speech difficulty.       Has some numbness in feet but has nerve injury  Hematological:  Negative for adenopathy. Does not bruise/bleed easily.  Psychiatric/Behavioral:  Negative for sleep disturbance and suicidal ideas.  The patient is not nervous/anxious.     MEDICAL HISTORY: No past medical history on file.  SURGICAL HISTORY: Past Surgical History:  Procedure Laterality Date   ANKLE SURGERY Left     SOCIAL HISTORY: Social History   Socioeconomic History   Marital status: Single    Spouse name: Not on file   Number of children: Not on file   Years of education: Not on file   Highest education level: Not on file  Occupational History   Not on file   Tobacco Use   Smoking status: Never   Smokeless tobacco: Never  Substance and Sexual Activity   Alcohol use: Yes   Drug use: Never   Sexual activity: Not on file  Other Topics Concern   Not on file  Social History Narrative   Not on file   Social Determinants of Health   Financial Resource Strain: Not on file  Food Insecurity: Not on file  Transportation Needs: Not on file  Physical Activity: Not on file  Stress: Not on file  Social Connections: Not on file  Intimate Partner Violence: Not on file    FAMILY HISTORY Family History  Problem Relation Age of Onset   Hypertension Father     ALLERGIES:  is allergic to black walnut pollen allergy skin test.  MEDICATIONS:  Current Outpatient Medications  Medication Sig Dispense Refill   meloxicam (MOBIC) 15 MG tablet Take 15 mg by mouth daily.     tretinoin (RETIN-A) 0.025 % cream      No current facility-administered medications for this visit.    PHYSICAL EXAMINATION:  ECOG PERFORMANCE STATUS: 0 - Asymptomatic   There were no vitals filed for this visit.  There were no vitals filed for this visit.   Physical Exam Vitals and nursing note reviewed.  Constitutional:      General: She is not in acute distress.    Appearance: Normal appearance. She is normal weight. She is not ill-appearing, toxic-appearing or diaphoretic.     Comments: Here alone.    HENT:     Head: Normocephalic and atraumatic.     Right Ear: External ear normal.     Left Ear: External ear normal.     Nose: Nose normal. No congestion or rhinorrhea.  Eyes:     General: No scleral icterus.    Extraocular Movements: Extraocular movements intact.     Conjunctiva/sclera: Conjunctivae normal.     Pupils: Pupils are equal, round, and reactive to light.  Cardiovascular:     Rate and Rhythm: Normal rate.     Heart sounds: No murmur heard.    No friction rub. No gallop.  Pulmonary:     Effort: Pulmonary effort is normal. No respiratory distress.      Breath sounds: Normal breath sounds. No stridor. No wheezing or rhonchi.  Abdominal:     General: Abdomen is flat. Bowel sounds are normal.     Palpations: Abdomen is soft.     Comments: No HSM  Musculoskeletal:        General: No swelling, tenderness or deformity.     Cervical back: Normal range of motion and neck supple. No rigidity or tenderness.     Right lower leg: No edema.     Left lower leg: No edema.  Lymphadenopathy:     Head:     Right side of head: No submental, submandibular, tonsillar, preauricular, posterior auricular or occipital adenopathy.     Left side of head: No submental, submandibular,  tonsillar, preauricular, posterior auricular or occipital adenopathy.     Cervical: No cervical adenopathy.     Right cervical: No superficial, deep or posterior cervical adenopathy.    Left cervical: No superficial, deep or posterior cervical adenopathy.     Upper Body:     Right upper body: No supraclavicular, axillary, pectoral or epitrochlear adenopathy.     Left upper body: No supraclavicular, axillary, pectoral or epitrochlear adenopathy.  Skin:    General: Skin is warm.     Coloration: Skin is not jaundiced or pale.     Findings: No erythema.  Neurological:     General: No focal deficit present.     Mental Status: She is alert and oriented to person, place, and time.     Cranial Nerves: No cranial nerve deficit.     Motor: No weakness.     Coordination: Coordination normal.     Gait: Gait normal.  Psychiatric:        Mood and Affect: Mood normal.        Behavior: Behavior normal.        Thought Content: Thought content normal.        Judgment: Judgment normal.     LABORATORY DATA: I have personally reviewed the data as listed:  No visits with results within 1 Month(s) from this visit.  Latest known visit with results is:  Appointment on 03/15/2022  Component Date Value Ref Range Status   WBC MORPHOLOGY 03/15/2022 MORPHOLOGY UNREMARKABLE   Final   RBC  MORPHOLOGY 03/15/2022 Marked spherocytes   Final   Plt Morphology 03/15/2022 Normal platelet morphology   Final   Clinical Information 03/15/2022 spherocytosis   Final   Performed at [facility], [address], [place], [place] [address]   LDH 03/15/2022 178  98 - 192 U/L Final   Performed at [facility], [address], [place], [place] [address]   Retic Ct Pct 03/15/2022 4.3 (H)  0.4 - 3.1 % Final   RBC. 03/15/2022 3.77 (L)  3.87 - 5.11 MIL/uL Final   Retic Count, Absolute 03/15/2022 160.2  19.0 - 186.0 K/uL Final   Immature Retic Fract 03/15/2022 7.9  2.3 - 15.9 % Final   Performed at [facility], [address], [place], [place] [address]   Vitamin B-12 03/15/2022 253  180 - 914 pg/mL Final   Comment: (NOTE) This assay is not validated for testing neonatal or myeloproliferative syndrome specimens for Vitamin B12 levels. Performed at [facility], [address], [place], [place] [address]    Folate 03/15/2022 11.4  >5.9 ng/mL Final   Performed at [facility], [address] [place], [place] [address]   Ferritin 03/15/2022 212  11 - 307 ng/mL Final   Performed at [facility], [address] [place], [place] [address]   Sodium 03/15/2022 139  135 - 145 mmol/L Final   Potassium 03/15/2022 3.5  3.5 - 5.1 mmol/L Final   Chloride 03/15/2022 105  98 - 111 mmol/L Final   CO2 03/15/2022 30  22 - 32 mmol/L Final   Glucose, Bld 03/15/2022 85  70 - 99 mg/dL Final   Glucose reference range applies only to samples taken after fasting for at least 8 hours.   BUN 03/15/2022 13  6 - 20 mg/dL Final   Creatinine 57/84/6962 0.73  0.44 - 1.00 mg/dL Final   Calcium 95/28/4132 10.1  8.9 - 10.3 mg/dL  Final   Total Protein 03/15/2022 7.9  6.5 - 8.1 g/dL Final   Albumin 40/98/1191 5.0  3.5 - 5.0 g/dL Final   AST 47/82/9562 17  15 - 41 U/L Final   ALT 03/15/2022 12   0 - 44 U/L Final   Alkaline Phosphatase 03/15/2022 39  38 - 126 U/L Final   Total Bilirubin 03/15/2022 1.5 (H)  0.3 - 1.2 mg/dL Final   GFR, Estimated 03/15/2022 >60  >60 mL/min Final   Comment: (NOTE) Calculated using the CKD-EPI Creatinine Equation (2021)    Anion gap 03/15/2022 4 (L)  5 - 15 Final   Performed at [facility], [address], [place], [place] [address]   WBC Count 03/15/2022 6.2  4.0 - 10.5 K/uL Final   RBC 03/15/2022 3.84 (L)  3.87 - 5.11 MIL/uL Final   Hemoglobin 03/15/2022 12.9  12.0 - 15.0 g/dL Final   HCT 57/84/6962 34.2 (L)  36.0 - 46.0 % Final   MCV 03/15/2022 89.1  80.0 - 100.0 fL Final   MCH 03/15/2022 33.6  26.0 - 34.0 pg Final   MCHC 03/15/2022 37.7 (H)  30.0 - 36.0 g/dL Final   RDW 95/28/4132 15.5  11.5 - 15.5 % Final   Platelet Count 03/15/2022 264  150 - 400 K/uL Final   nRBC 03/15/2022 0.0  0.0 - 0.2 % Final   Neutrophils Relative % 03/15/2022 75  % Final   Neutro Abs 03/15/2022 4.6  1.7 - 7.7 K/uL Final   Lymphocytes Relative 03/15/2022 20  % Final   Lymphs Abs 03/15/2022 1.2  0.7 - 4.0 K/uL Final   Monocytes Relative 03/15/2022 4  % Final   Monocytes Absolute 03/15/2022 0.3  0.1 - 1.0 K/uL Final   Eosinophils Relative 03/15/2022 1  % Final   Eosinophils Absolute 03/15/2022 0.0  0.0 - 0.5 K/uL Final   Basophils Relative 03/15/2022 0  % Final   Basophils Absolute 03/15/2022 0.0  0.0 - 0.1 K/uL Final   WBC Morphology 03/15/2022 MORPHOLOGY UNREMARKABLE   Final   Smear Review 03/15/2022 Normal platelet morphology   Final   Immature Granulocytes 03/15/2022 0  % Final   Abs Immature Granulocytes 03/15/2022 0.01  0.00 - 0.07 K/uL Final   Spherocytes 03/15/2022 MARKED   Final   Performed at [facility], [address], [place], [place] [address]    RADIOGRAPHIC STUDIES: I have personally reviewed the radiological images as listed and agree with the findings in the report  No results  found.  ASSESSMENT/PLAN  Hereditary spherocytosis (HS):  Heterogeneous group of disorders characterized by hemolytic anemia, splenomegaly due to the presence of spherical-shaped erythrocytes (spherocytes).  Patients experience anemia, jaundice, and splenomegaly, with variable severity. May also experience hepatomegaly.  Common complications include cholelithiasis, hemolytic episodes, and aplastic crises.  Results from abnormalities of membrane proteins:  spectrin, ankyrin, actin, band 4.1, and band 3.    Mild (20-30%) (Hgb 10 to 11.5 g/dL in females or 10 to 27.2 g/dL in males):  Very mild anemia or sometimes have no symptoms Inheritance:   Autosomal dominant (75 %):  Inherited from one affected parent or from new mutations in the gene. Exacerbations of anemia may also occur in certain settings:  Patient appears to have a mild form of the disease.    September 13 2022:  Hgb 11.6 Folate 8.9 B12 227 LDH 186 Retic 4.1.  Asymptomatic.  Encouraged patient to begin folic acid 1 mg daily  Infections - Can impair erythropoiesis and thus diminish the capacity to compensate for chronic hemolysis may lead to a period of aplasia. Commonly cited cause of transient aplastic crisis is parvovirus B19; other viral or bacterial infections may also cause transient aplasia. Those with chronic hemolysis are highly dependent on the accelerated production of new RBCs by the bone marrow, and they can experience a rapid drop in hemoglobin level when the bone marrow is unable to compensate for hemolysis.  Infections may also exacerbate hemolysis, causing worsening of anemia even with increased reticulocyte count and parallel increase of bilirubin and lactate dehydrogenase (LDH).   Nutrient deficiencies - Individuals who become deficient in folate, vitamin B12, or iron may be unable to produce sufficient RBCs to compensate for those destroyed by hemolysis Iron deficiency is rare in hereditary spherocytosis.  y  Pregnancy -  Anemia may worsen during pregnancy as the RBC mass and plasma volume expand to meet the physiologic needs of the pregnancy. Attention to folic acid supplementation is especially important during pregnancy to avoid increased risk of neural tube defects in the fetus and superimposed megaloblastic anemia for the mother     Cancer Staging  No matching staging information was found for the patient.   No problem-specific Assessment & Plan notes found for this encounter.   No orders of the defined types were placed in this encounter.  20 minutes was spent in patient care.  This included time spent preparing to see the patient (e.g., review of tests), obtaining and/or reviewing separately obtained history, counseling and educating the patient/family/caregiver, ordering medications, tests; documenting clinical information in the electronic or other health record, independently interpreting results and communicating results to the patient as well as coordination of care.       All questions were answered. The patient knows to call the clinic with any problems, questions or concerns.  One hour spent in face to face time.  This included answering questions and discussing pathophysiology and management of her condition   This note was electronically signed.    Loni Muse, MD  09/12/2022 8:44 AM

## 2022-09-13 ENCOUNTER — Other Ambulatory Visit: Payer: Self-pay

## 2022-09-13 ENCOUNTER — Inpatient Hospital Stay: Payer: BC Managed Care – PPO | Attending: Oncology

## 2022-09-13 ENCOUNTER — Inpatient Hospital Stay: Payer: BC Managed Care – PPO | Admitting: Oncology

## 2022-09-13 VITALS — BP 114/77 | HR 90 | Temp 98.8°F | Resp 16 | Wt 134.1 lb

## 2022-09-13 DIAGNOSIS — R5382 Chronic fatigue, unspecified: Secondary | ICD-10-CM | POA: Diagnosis not present

## 2022-09-13 DIAGNOSIS — Z79899 Other long term (current) drug therapy: Secondary | ICD-10-CM | POA: Insufficient documentation

## 2022-09-13 DIAGNOSIS — D580 Hereditary spherocytosis: Secondary | ICD-10-CM | POA: Insufficient documentation

## 2022-09-13 LAB — CBC WITH DIFFERENTIAL (CANCER CENTER ONLY)
Abs Immature Granulocytes: 0.01 10*3/uL (ref 0.00–0.07)
Basophils Absolute: 0 10*3/uL (ref 0.0–0.1)
Basophils Relative: 1 %
Eosinophils Absolute: 0 10*3/uL (ref 0.0–0.5)
Eosinophils Relative: 0 %
HCT: 31.5 % — ABNORMAL LOW (ref 36.0–46.0)
Hemoglobin: 11.6 g/dL — ABNORMAL LOW (ref 12.0–15.0)
Immature Granulocytes: 0 %
Lymphocytes Relative: 16 %
Lymphs Abs: 1.1 10*3/uL (ref 0.7–4.0)
MCH: 34.1 pg — ABNORMAL HIGH (ref 26.0–34.0)
MCHC: 36.8 g/dL — ABNORMAL HIGH (ref 30.0–36.0)
MCV: 92.6 fL (ref 80.0–100.0)
Monocytes Absolute: 0.3 10*3/uL (ref 0.1–1.0)
Monocytes Relative: 5 %
Neutro Abs: 5.3 10*3/uL (ref 1.7–7.7)
Neutrophils Relative %: 78 %
Platelet Count: 220 10*3/uL (ref 150–400)
RBC: 3.4 MIL/uL — ABNORMAL LOW (ref 3.87–5.11)
RDW: 16.7 % — ABNORMAL HIGH (ref 11.5–15.5)
WBC Count: 6.7 10*3/uL (ref 4.0–10.5)
nRBC: 0 % (ref 0.0–0.2)

## 2022-09-13 LAB — RETICULOCYTES
Immature Retic Fract: 11.7 % (ref 2.3–15.9)
RBC.: 3.55 MIL/uL — ABNORMAL LOW (ref 3.87–5.11)
Retic Count, Absolute: 146 10*3/uL (ref 19.0–186.0)
Retic Ct Pct: 4.1 % — ABNORMAL HIGH (ref 0.4–3.1)

## 2022-09-13 LAB — FOLATE: Folate: 8.9 ng/mL (ref >5.9)

## 2022-09-13 LAB — VITAMIN B12: Vitamin B-12: 227 pg/mL (ref 180–914)

## 2022-09-13 LAB — LACTATE DEHYDROGENASE: LDH: 186 U/L (ref 98–192)

## 2022-09-13 NOTE — Patient Instructions (Signed)
Please begin folic acid 1 mg daily 

## 2022-09-17 DIAGNOSIS — M9901 Segmental and somatic dysfunction of cervical region: Secondary | ICD-10-CM | POA: Diagnosis not present

## 2022-09-17 DIAGNOSIS — M9902 Segmental and somatic dysfunction of thoracic region: Secondary | ICD-10-CM | POA: Diagnosis not present

## 2022-09-17 DIAGNOSIS — M9905 Segmental and somatic dysfunction of pelvic region: Secondary | ICD-10-CM | POA: Diagnosis not present

## 2022-09-17 DIAGNOSIS — M9903 Segmental and somatic dysfunction of lumbar region: Secondary | ICD-10-CM | POA: Diagnosis not present

## 2022-09-20 ENCOUNTER — Ambulatory Visit: Payer: BC Managed Care – PPO | Admitting: Oncology

## 2022-09-20 ENCOUNTER — Other Ambulatory Visit: Payer: BC Managed Care – PPO

## 2022-10-03 DIAGNOSIS — M9905 Segmental and somatic dysfunction of pelvic region: Secondary | ICD-10-CM | POA: Diagnosis not present

## 2022-10-03 DIAGNOSIS — M9902 Segmental and somatic dysfunction of thoracic region: Secondary | ICD-10-CM | POA: Diagnosis not present

## 2022-10-03 DIAGNOSIS — M9903 Segmental and somatic dysfunction of lumbar region: Secondary | ICD-10-CM | POA: Diagnosis not present

## 2022-10-03 DIAGNOSIS — M9901 Segmental and somatic dysfunction of cervical region: Secondary | ICD-10-CM | POA: Diagnosis not present

## 2022-10-16 DIAGNOSIS — N6311 Unspecified lump in the right breast, upper outer quadrant: Secondary | ICD-10-CM | POA: Diagnosis not present

## 2022-10-21 ENCOUNTER — Other Ambulatory Visit: Payer: Self-pay | Admitting: Nurse Practitioner

## 2022-10-21 DIAGNOSIS — N644 Mastodynia: Secondary | ICD-10-CM

## 2022-10-21 DIAGNOSIS — M9901 Segmental and somatic dysfunction of cervical region: Secondary | ICD-10-CM | POA: Diagnosis not present

## 2022-10-21 DIAGNOSIS — M9905 Segmental and somatic dysfunction of pelvic region: Secondary | ICD-10-CM | POA: Diagnosis not present

## 2022-10-21 DIAGNOSIS — M9903 Segmental and somatic dysfunction of lumbar region: Secondary | ICD-10-CM | POA: Diagnosis not present

## 2022-10-21 DIAGNOSIS — M9902 Segmental and somatic dysfunction of thoracic region: Secondary | ICD-10-CM | POA: Diagnosis not present

## 2022-10-24 DIAGNOSIS — M9903 Segmental and somatic dysfunction of lumbar region: Secondary | ICD-10-CM | POA: Diagnosis not present

## 2022-10-24 DIAGNOSIS — M9901 Segmental and somatic dysfunction of cervical region: Secondary | ICD-10-CM | POA: Diagnosis not present

## 2022-10-24 DIAGNOSIS — M9905 Segmental and somatic dysfunction of pelvic region: Secondary | ICD-10-CM | POA: Diagnosis not present

## 2022-10-24 DIAGNOSIS — M9902 Segmental and somatic dysfunction of thoracic region: Secondary | ICD-10-CM | POA: Diagnosis not present

## 2022-10-28 ENCOUNTER — Ambulatory Visit: Admission: RE | Admit: 2022-10-28 | Payer: BC Managed Care – PPO | Source: Ambulatory Visit

## 2022-10-28 ENCOUNTER — Other Ambulatory Visit: Payer: Self-pay | Admitting: Nurse Practitioner

## 2022-10-28 DIAGNOSIS — N6311 Unspecified lump in the right breast, upper outer quadrant: Secondary | ICD-10-CM | POA: Diagnosis not present

## 2022-10-28 DIAGNOSIS — N6310 Unspecified lump in the right breast, unspecified quadrant: Secondary | ICD-10-CM

## 2022-10-28 DIAGNOSIS — N644 Mastodynia: Secondary | ICD-10-CM

## 2022-10-31 DIAGNOSIS — M9902 Segmental and somatic dysfunction of thoracic region: Secondary | ICD-10-CM | POA: Diagnosis not present

## 2022-10-31 DIAGNOSIS — M9905 Segmental and somatic dysfunction of pelvic region: Secondary | ICD-10-CM | POA: Diagnosis not present

## 2022-10-31 DIAGNOSIS — M9903 Segmental and somatic dysfunction of lumbar region: Secondary | ICD-10-CM | POA: Diagnosis not present

## 2022-10-31 DIAGNOSIS — M9901 Segmental and somatic dysfunction of cervical region: Secondary | ICD-10-CM | POA: Diagnosis not present

## 2022-11-05 ENCOUNTER — Ambulatory Visit
Admission: RE | Admit: 2022-11-05 | Discharge: 2022-11-05 | Disposition: A | Discharge status: Home / Self Care | Payer: BC Managed Care – PPO | Source: Ambulatory Visit | Attending: Nurse Practitioner | Admitting: Nurse Practitioner

## 2022-11-05 DIAGNOSIS — N6311 Unspecified lump in the right breast, upper outer quadrant: Secondary | ICD-10-CM | POA: Diagnosis not present

## 2022-11-05 DIAGNOSIS — N6310 Unspecified lump in the right breast, unspecified quadrant: Secondary | ICD-10-CM

## 2022-11-05 DIAGNOSIS — Z124 Encounter for screening for malignant neoplasm of cervix: Secondary | ICD-10-CM | POA: Diagnosis not present

## 2022-11-05 DIAGNOSIS — Z01419 Encounter for gynecological examination (general) (routine) without abnormal findings: Secondary | ICD-10-CM | POA: Diagnosis not present

## 2022-11-05 DIAGNOSIS — D241 Benign neoplasm of right breast: Secondary | ICD-10-CM | POA: Diagnosis not present

## 2022-11-05 HISTORY — PX: BREAST BIOPSY: SHX20

## 2022-11-06 DIAGNOSIS — M9901 Segmental and somatic dysfunction of cervical region: Secondary | ICD-10-CM | POA: Diagnosis not present

## 2022-11-06 DIAGNOSIS — M9902 Segmental and somatic dysfunction of thoracic region: Secondary | ICD-10-CM | POA: Diagnosis not present

## 2022-11-06 DIAGNOSIS — M9903 Segmental and somatic dysfunction of lumbar region: Secondary | ICD-10-CM | POA: Diagnosis not present

## 2022-11-06 DIAGNOSIS — M9905 Segmental and somatic dysfunction of pelvic region: Secondary | ICD-10-CM | POA: Diagnosis not present

## 2022-11-12 DIAGNOSIS — M9902 Segmental and somatic dysfunction of thoracic region: Secondary | ICD-10-CM | POA: Diagnosis not present

## 2022-11-12 DIAGNOSIS — M9905 Segmental and somatic dysfunction of pelvic region: Secondary | ICD-10-CM | POA: Diagnosis not present

## 2022-11-12 DIAGNOSIS — M9901 Segmental and somatic dysfunction of cervical region: Secondary | ICD-10-CM | POA: Diagnosis not present

## 2022-11-12 DIAGNOSIS — M9903 Segmental and somatic dysfunction of lumbar region: Secondary | ICD-10-CM | POA: Diagnosis not present

## 2022-11-21 DIAGNOSIS — M9901 Segmental and somatic dysfunction of cervical region: Secondary | ICD-10-CM | POA: Diagnosis not present

## 2022-11-21 DIAGNOSIS — M9905 Segmental and somatic dysfunction of pelvic region: Secondary | ICD-10-CM | POA: Diagnosis not present

## 2022-11-21 DIAGNOSIS — M9902 Segmental and somatic dysfunction of thoracic region: Secondary | ICD-10-CM | POA: Diagnosis not present

## 2022-11-21 DIAGNOSIS — M9903 Segmental and somatic dysfunction of lumbar region: Secondary | ICD-10-CM | POA: Diagnosis not present

## 2022-11-29 DIAGNOSIS — D241 Benign neoplasm of right breast: Secondary | ICD-10-CM | POA: Diagnosis not present

## 2022-12-03 DIAGNOSIS — M9901 Segmental and somatic dysfunction of cervical region: Secondary | ICD-10-CM | POA: Diagnosis not present

## 2022-12-03 DIAGNOSIS — M9905 Segmental and somatic dysfunction of pelvic region: Secondary | ICD-10-CM | POA: Diagnosis not present

## 2022-12-03 DIAGNOSIS — M9902 Segmental and somatic dysfunction of thoracic region: Secondary | ICD-10-CM | POA: Diagnosis not present

## 2022-12-03 DIAGNOSIS — M9903 Segmental and somatic dysfunction of lumbar region: Secondary | ICD-10-CM | POA: Diagnosis not present

## 2022-12-26 DIAGNOSIS — M9903 Segmental and somatic dysfunction of lumbar region: Secondary | ICD-10-CM | POA: Diagnosis not present

## 2022-12-26 DIAGNOSIS — M9901 Segmental and somatic dysfunction of cervical region: Secondary | ICD-10-CM | POA: Diagnosis not present

## 2022-12-26 DIAGNOSIS — M9902 Segmental and somatic dysfunction of thoracic region: Secondary | ICD-10-CM | POA: Diagnosis not present

## 2022-12-26 DIAGNOSIS — M9905 Segmental and somatic dysfunction of pelvic region: Secondary | ICD-10-CM | POA: Diagnosis not present

## 2023-01-06 ENCOUNTER — Other Ambulatory Visit: Payer: BC Managed Care – PPO

## 2023-01-16 DIAGNOSIS — M9901 Segmental and somatic dysfunction of cervical region: Secondary | ICD-10-CM | POA: Diagnosis not present

## 2023-01-16 DIAGNOSIS — M9903 Segmental and somatic dysfunction of lumbar region: Secondary | ICD-10-CM | POA: Diagnosis not present

## 2023-01-16 DIAGNOSIS — M9902 Segmental and somatic dysfunction of thoracic region: Secondary | ICD-10-CM | POA: Diagnosis not present

## 2023-01-16 DIAGNOSIS — M9905 Segmental and somatic dysfunction of pelvic region: Secondary | ICD-10-CM | POA: Diagnosis not present

## 2023-02-13 DIAGNOSIS — M9901 Segmental and somatic dysfunction of cervical region: Secondary | ICD-10-CM | POA: Diagnosis not present

## 2023-02-13 DIAGNOSIS — M9905 Segmental and somatic dysfunction of pelvic region: Secondary | ICD-10-CM | POA: Diagnosis not present

## 2023-02-13 DIAGNOSIS — M9902 Segmental and somatic dysfunction of thoracic region: Secondary | ICD-10-CM | POA: Diagnosis not present

## 2023-02-13 DIAGNOSIS — M9903 Segmental and somatic dysfunction of lumbar region: Secondary | ICD-10-CM | POA: Diagnosis not present

## 2023-03-13 DIAGNOSIS — M9901 Segmental and somatic dysfunction of cervical region: Secondary | ICD-10-CM | POA: Diagnosis not present

## 2023-03-13 DIAGNOSIS — M9902 Segmental and somatic dysfunction of thoracic region: Secondary | ICD-10-CM | POA: Diagnosis not present

## 2023-03-13 DIAGNOSIS — M9903 Segmental and somatic dysfunction of lumbar region: Secondary | ICD-10-CM | POA: Diagnosis not present

## 2023-03-13 DIAGNOSIS — M9905 Segmental and somatic dysfunction of pelvic region: Secondary | ICD-10-CM | POA: Diagnosis not present

## 2023-04-10 DIAGNOSIS — M9905 Segmental and somatic dysfunction of pelvic region: Secondary | ICD-10-CM | POA: Diagnosis not present

## 2023-04-10 DIAGNOSIS — M9903 Segmental and somatic dysfunction of lumbar region: Secondary | ICD-10-CM | POA: Diagnosis not present

## 2023-04-10 DIAGNOSIS — M9901 Segmental and somatic dysfunction of cervical region: Secondary | ICD-10-CM | POA: Diagnosis not present

## 2023-04-10 DIAGNOSIS — M9902 Segmental and somatic dysfunction of thoracic region: Secondary | ICD-10-CM | POA: Diagnosis not present

## 2023-05-08 DIAGNOSIS — N6311 Unspecified lump in the right breast, upper outer quadrant: Secondary | ICD-10-CM | POA: Diagnosis not present

## 2023-05-08 DIAGNOSIS — L309 Dermatitis, unspecified: Secondary | ICD-10-CM | POA: Diagnosis not present

## 2023-05-09 ENCOUNTER — Other Ambulatory Visit: Payer: Self-pay | Admitting: Nurse Practitioner

## 2023-05-09 DIAGNOSIS — N63 Unspecified lump in unspecified breast: Secondary | ICD-10-CM

## 2023-05-12 DIAGNOSIS — M9902 Segmental and somatic dysfunction of thoracic region: Secondary | ICD-10-CM | POA: Diagnosis not present

## 2023-05-12 DIAGNOSIS — M9903 Segmental and somatic dysfunction of lumbar region: Secondary | ICD-10-CM | POA: Diagnosis not present

## 2023-05-12 DIAGNOSIS — M9905 Segmental and somatic dysfunction of pelvic region: Secondary | ICD-10-CM | POA: Diagnosis not present

## 2023-05-12 DIAGNOSIS — M9901 Segmental and somatic dysfunction of cervical region: Secondary | ICD-10-CM | POA: Diagnosis not present

## 2023-05-16 DIAGNOSIS — J0100 Acute maxillary sinusitis, unspecified: Secondary | ICD-10-CM | POA: Diagnosis not present

## 2023-05-22 ENCOUNTER — Ambulatory Visit
Admission: RE | Admit: 2023-05-22 | Discharge: 2023-05-22 | Disposition: A | Discharge status: Home / Self Care | Payer: BC Managed Care – PPO | Source: Ambulatory Visit | Attending: Nurse Practitioner | Admitting: Nurse Practitioner

## 2023-05-22 DIAGNOSIS — N63 Unspecified lump in unspecified breast: Secondary | ICD-10-CM

## 2023-05-22 DIAGNOSIS — N6021 Fibroadenosis of right breast: Secondary | ICD-10-CM | POA: Diagnosis not present

## 2023-06-13 DIAGNOSIS — M9905 Segmental and somatic dysfunction of pelvic region: Secondary | ICD-10-CM | POA: Diagnosis not present

## 2023-06-13 DIAGNOSIS — M9901 Segmental and somatic dysfunction of cervical region: Secondary | ICD-10-CM | POA: Diagnosis not present

## 2023-06-13 DIAGNOSIS — M9903 Segmental and somatic dysfunction of lumbar region: Secondary | ICD-10-CM | POA: Diagnosis not present

## 2023-06-13 DIAGNOSIS — M9902 Segmental and somatic dysfunction of thoracic region: Secondary | ICD-10-CM | POA: Diagnosis not present

## 2023-07-11 DIAGNOSIS — M9901 Segmental and somatic dysfunction of cervical region: Secondary | ICD-10-CM | POA: Diagnosis not present

## 2023-07-11 DIAGNOSIS — M9902 Segmental and somatic dysfunction of thoracic region: Secondary | ICD-10-CM | POA: Diagnosis not present

## 2023-07-11 DIAGNOSIS — M79675 Pain in left toe(s): Secondary | ICD-10-CM | POA: Diagnosis not present

## 2023-07-11 DIAGNOSIS — M9903 Segmental and somatic dysfunction of lumbar region: Secondary | ICD-10-CM | POA: Diagnosis not present

## 2023-07-11 DIAGNOSIS — M9906 Segmental and somatic dysfunction of lower extremity: Secondary | ICD-10-CM | POA: Diagnosis not present

## 2023-07-11 DIAGNOSIS — M9905 Segmental and somatic dysfunction of pelvic region: Secondary | ICD-10-CM | POA: Diagnosis not present

## 2023-08-07 DIAGNOSIS — M9901 Segmental and somatic dysfunction of cervical region: Secondary | ICD-10-CM | POA: Diagnosis not present

## 2023-08-07 DIAGNOSIS — M9905 Segmental and somatic dysfunction of pelvic region: Secondary | ICD-10-CM | POA: Diagnosis not present

## 2023-08-07 DIAGNOSIS — M79675 Pain in left toe(s): Secondary | ICD-10-CM | POA: Diagnosis not present

## 2023-08-07 DIAGNOSIS — M9903 Segmental and somatic dysfunction of lumbar region: Secondary | ICD-10-CM | POA: Diagnosis not present

## 2023-08-07 DIAGNOSIS — M9902 Segmental and somatic dysfunction of thoracic region: Secondary | ICD-10-CM | POA: Diagnosis not present

## 2023-08-07 DIAGNOSIS — M9906 Segmental and somatic dysfunction of lower extremity: Secondary | ICD-10-CM | POA: Diagnosis not present

## 2023-09-04 DIAGNOSIS — M9905 Segmental and somatic dysfunction of pelvic region: Secondary | ICD-10-CM | POA: Diagnosis not present

## 2023-09-04 DIAGNOSIS — M79675 Pain in left toe(s): Secondary | ICD-10-CM | POA: Diagnosis not present

## 2023-09-04 DIAGNOSIS — M9903 Segmental and somatic dysfunction of lumbar region: Secondary | ICD-10-CM | POA: Diagnosis not present

## 2023-09-04 DIAGNOSIS — M9906 Segmental and somatic dysfunction of lower extremity: Secondary | ICD-10-CM | POA: Diagnosis not present

## 2023-09-04 DIAGNOSIS — M9901 Segmental and somatic dysfunction of cervical region: Secondary | ICD-10-CM | POA: Diagnosis not present

## 2023-09-04 DIAGNOSIS — M9902 Segmental and somatic dysfunction of thoracic region: Secondary | ICD-10-CM | POA: Diagnosis not present

## 2023-09-18 ENCOUNTER — Other Ambulatory Visit: Payer: Self-pay

## 2023-09-18 DIAGNOSIS — E538 Deficiency of other specified B group vitamins: Secondary | ICD-10-CM

## 2023-09-18 DIAGNOSIS — D580 Hereditary spherocytosis: Secondary | ICD-10-CM

## 2023-09-18 NOTE — Progress Notes (Signed)
 Labs ordered for 6/13.

## 2023-09-18 NOTE — Assessment & Plan Note (Deleted)
 Understand measures for HS is mainly supportive.  Folic acid  1 mg daily. Increase to 3 mg daily if planning on pregnancy. 1. Aplastic crisis: most commonly due to parvo virus infection, clinically manifested by sudden pallor, fatigue, pronounce fall in the hemoglobin and in the reticulocyte count. 2. Hemolytic crisis: most commonly associated with a cute infectious process, clinically manifested by pallor, jaundice, increased in the reticulocyte count form baseline and increase in baseline spleen size. 3. Obstructive cholelithiasis: due to chronic hyperbilirubinemia.

## 2023-09-18 NOTE — Progress Notes (Deleted)
 [facility] OFFICE PROGRESS NOTE  Patient Care Team: Stamey, Lowell Rude, FNP as PCP - General (Family Medicine)  Assessment & Plan Hereditary spherocytosis (HCC) Understand measures for HS is mainly supportive.  Folic acid  1 mg daily. Increase to 3 mg daily if planning on pregnancy. 1. Aplastic crisis: most commonly due to parvo virus infection, clinically manifested by sudden pallor, fatigue, pronounce fall in the hemoglobin and in the reticulocyte count. 2. Hemolytic crisis: most commonly associated with a cute infectious process, clinically manifested by pallor, jaundice, increased in the reticulocyte count form baseline and increase in baseline spleen size. 3. Obstructive cholelithiasis: due to chronic hyperbilirubinemia.    No orders of the defined types were placed in this encounter.    Lowanda Ruddy, MD  INTERVAL HISTORY: Patient returns for follow-up.  Hematology history: 03/15/2022: [facility] Hematology Consult Review of patient's history showed she was diagnosed of hereditary spherocytosis at age 23 when she presented with anemia jaundice and fatigue.  She was hospitalized overnight did not require transfusion with packed red blood cells.  She also does have a history of having mononucleosis.  Per review of records paternal side of the family has hereditary spherocytosis.  Patient's father and siblings have been diagnosed with this.  She has not had a history of aplastic crisis.  She was followed by pediatric hematology and then transition to the adult clinic at [facility]   09/03/16  WBC of 5.3 hemoglobin 12.4 MCV 88.3 platelet count 249; 63 segs 29 lymphs 5 monos 2 eos 1 basophil.  Reticulocyte count 3.2% Hepatic function panel at that time notable for T. bili 0.2 alk phos 41 AST 13 ALT 8  09/13/2022:  Hgb 11.6 Folate 8.9 B12 227 LDH 186 Retic 4.1.  Asymptomatic.  Encouraged patient to begin folic acid  1 mg daily    No past medical history on file.  Past  Surgical History:  Procedure Laterality Date   ANKLE SURGERY Left    BREAST BIOPSY Right 11/05/2022   US  RT BREAST BX W LOC DEV 1ST LESION IMG BX SPEC US  GUIDE 11/05/2022 [facility] MAMMOGRAPHY     PHYSICAL EXAMINATION: ECOG PERFORMANCE STATUS: {CHL ONC ECOG PS:[id number]}  There were no vitals filed for this visit. There were no vitals filed for this visit.  Relevant data reviewed during this visit included ***

## 2023-09-19 ENCOUNTER — Inpatient Hospital Stay

## 2023-09-19 ENCOUNTER — Telehealth: Payer: Self-pay | Admitting: *Deleted

## 2023-09-19 ENCOUNTER — Inpatient Hospital Stay: Payer: BC Managed Care – PPO | Attending: Internal Medicine

## 2023-09-19 ENCOUNTER — Ambulatory Visit: Payer: BC Managed Care – PPO | Admitting: Oncology

## 2023-09-19 VITALS — BP 99/70 | HR 63 | Temp 98.2°F | Resp 16 | Ht 71.0 in | Wt 133.9 lb

## 2023-09-19 DIAGNOSIS — Z8619 Personal history of other infectious and parasitic diseases: Secondary | ICD-10-CM | POA: Diagnosis not present

## 2023-09-19 DIAGNOSIS — Z832 Family history of diseases of the blood and blood-forming organs and certain disorders involving the immune mechanism: Secondary | ICD-10-CM | POA: Diagnosis not present

## 2023-09-19 DIAGNOSIS — Z79899 Other long term (current) drug therapy: Secondary | ICD-10-CM | POA: Diagnosis not present

## 2023-09-19 DIAGNOSIS — D580 Hereditary spherocytosis: Secondary | ICD-10-CM | POA: Diagnosis not present

## 2023-09-19 DIAGNOSIS — E538 Deficiency of other specified B group vitamins: Secondary | ICD-10-CM

## 2023-09-19 LAB — VITAMIN B12: Vitamin B-12: 260 pg/mL (ref 180–914)

## 2023-09-19 LAB — FERRITIN: Ferritin: 398 ng/mL — ABNORMAL HIGH (ref 11–307)

## 2023-09-19 LAB — CBC WITH DIFFERENTIAL (CANCER CENTER ONLY)
Abs Immature Granulocytes: 0.03 10*3/uL (ref 0.00–0.07)
Basophils Absolute: 0 10*3/uL (ref 0.0–0.1)
Basophils Relative: 0 %
Eosinophils Absolute: 0 10*3/uL (ref 0.0–0.5)
Eosinophils Relative: 0 %
HCT: 32.7 % — ABNORMAL LOW (ref 36.0–46.0)
Hemoglobin: 12.1 g/dL (ref 12.0–15.0)
Immature Granulocytes: 0 %
Lymphocytes Relative: 14 %
Lymphs Abs: 1.1 10*3/uL (ref 0.7–4.0)
MCH: 32.2 pg (ref 26.0–34.0)
MCHC: 37 g/dL — ABNORMAL HIGH (ref 30.0–36.0)
MCV: 87 fL (ref 80.0–100.0)
Monocytes Absolute: 0.3 10*3/uL (ref 0.1–1.0)
Monocytes Relative: 4 %
Neutro Abs: 6.4 10*3/uL (ref 1.7–7.7)
Neutrophils Relative %: 82 %
Platelet Count: 226 10*3/uL (ref 150–400)
RBC: 3.76 MIL/uL — ABNORMAL LOW (ref 3.87–5.11)
RDW: 15.7 % — ABNORMAL HIGH (ref 11.5–15.5)
WBC Count: 7.9 10*3/uL (ref 4.0–10.5)
nRBC: 0 % (ref 0.0–0.2)

## 2023-09-19 LAB — HEPATIC FUNCTION PANEL
ALT: 15 U/L (ref 0–44)
AST: 20 U/L (ref 15–41)
Albumin: 4.2 g/dL (ref 3.5–5.0)
Alkaline Phosphatase: 39 U/L (ref 38–126)
Bilirubin, Direct: 0.3 mg/dL — ABNORMAL HIGH (ref 0.0–0.2)
Indirect Bilirubin: 1.3 mg/dL — ABNORMAL HIGH (ref 0.3–0.9)
Total Bilirubin: 1.6 mg/dL — ABNORMAL HIGH (ref 0.0–1.2)
Total Protein: 6.8 g/dL (ref 6.5–8.1)

## 2023-09-19 LAB — FOLATE: Folate: 11.3 ng/mL (ref >5.9)

## 2023-09-19 LAB — IRON AND IRON BINDING CAPACITY (CC-WL,HP ONLY)
Iron: 96 ug/dL (ref 28–170)
Saturation Ratios: 36 % — ABNORMAL HIGH (ref 10.4–31.8)
TIBC: 266 ug/dL (ref 250–450)
UIBC: 170 ug/dL (ref 148–442)

## 2023-09-19 NOTE — Assessment & Plan Note (Addendum)
 Understand measures for HS is mainly supportive.  Folic acid  0.8-1 mg daily. Increase to 3 mg daily if planning on pregnancy.  1. Aplastic crisis: most commonly due to parvo virus infection, clinically manifested by sudden pallor, fatigue, pronounce fall in the hemoglobin and in the reticulocyte count. 2. Hemolytic crisis: most commonly associated with a cute infectious process, clinically manifested by pallor, jaundice, increased in the reticulocyte count form baseline and increase in baseline spleen size. 3. Obstructive cholelithiasis: due to chronic hyperbilirubinemia.

## 2023-09-19 NOTE — Telephone Encounter (Signed)
 Pt was in the building but not arrived to OV until 10:28. Pt was rescheduled at 11am. Dr. Alita Irwin OK with seeing pt.

## 2023-09-19 NOTE — Progress Notes (Signed)
 [facility] OFFICE PROGRESS NOTE  Patient Care Team: Stamey, Lowell Rude, FNP as PCP - General (Family Medicine)  Assessment & Plan Hereditary spherocytosis (HCC) Understand measures for HS is mainly supportive.  Folic acid  0.8-1 mg daily. Increase to 3 mg daily if planning on pregnancy.  1. Aplastic crisis: most commonly due to parvo virus infection, clinically manifested by sudden pallor, fatigue, pronounce fall in the hemoglobin and in the reticulocyte count. 2. Hemolytic crisis: most commonly associated with a cute infectious process, clinically manifested by pallor, jaundice, increased in the reticulocyte count form baseline and increase in baseline spleen size. 3. Obstructive cholelithiasis: due to chronic hyperbilirubinemia.    Orders Placed This Encounter  Procedures   CBC with Differential (Cancer Center Only)    Standing Status:   Future    Expiration Date:   09/18/2024   Ferritin    Standing Status:   Future    Expiration Date:   09/18/2024   Folate    Standing Status:   Future    Expiration Date:   09/18/2024   Vitamin B12    Standing Status:   Future    Expiration Date:   09/18/2024   Retic Panel    Standing Status:   Future    Expiration Date:   09/18/2024   Lactate dehydrogenase (LDH)    Standing Status:   Future    Expiration Date:   09/18/2024   Hepatic function panel    Standing Status:   Future    Expiration Date:   09/18/2024     Frances Ruddy, MD  INTERVAL HISTORY: Patient returns for follow-up. Monthly menstrual bleeding for about 4 days. Heavier before her having IUD placed since 2018.  Hematology history: 03/15/2022: [facility] Hematology Consult Review of patient's history showed she was diagnosed of hereditary spherocytosis at age 78 when she presented with anemia, jaundice, and fatigue.  She was hospitalized overnight did not require transfusion with packed red blood cells.  She also does have a history of having mononucleosis.  Per  review of records paternal side of the family has hereditary spherocytosis.  Patient's father and siblings have been diagnosed with this.  She has not had a history of aplastic crisis.  She was followed by pediatric hematology and then transition to the adult clinic at [facility]   09/03/16  WBC of 5.3 hemoglobin 12.4 MCV 88.3 platelet count 249; 63 segs 29 lymphs 5 monos 2 eos 1 basophil.  Reticulocyte count 3.2% Hepatic function panel at that time notable for T. bili 0.2 alk phos 41 AST 13 ALT 8  09/13/2022:  Hgb 11.6 Folate 8.9 B12 227 LDH 186 Retic 4.1.  Asymptomatic.  Encouraged patient to begin folic acid  1 mg daily   09/19/23 Hgb 12.1 MCV 87  No past medical history on file.  Past Surgical History:  Procedure Laterality Date   ANKLE SURGERY Left    BREAST BIOPSY Right 11/05/2022   US  RT BREAST BX W LOC DEV 1ST LESION IMG BX SPEC US  GUIDE 11/05/2022 [facility] MAMMOGRAPHY     PHYSICAL EXAMINATION:  Vitals:   09/19/23 1100  BP: 99/70  Pulse: 63  Resp: 16  Temp: 98.2 F (36.8 C)  SpO2: 100%   Filed Weights   09/19/23 1100  Weight: 133 lb 14.4 oz (60.7 kg)   Skin color normal. Not pale. No jaundice.  Relevant data reviewed during this visit included labs.

## 2023-09-19 NOTE — Telephone Encounter (Signed)
 Called pt to make aware of missed appt. Left message to call and reschedule.

## 2023-09-22 ENCOUNTER — Ambulatory Visit: Payer: Self-pay

## 2023-09-22 LAB — METHYLMALONIC ACID, SERUM: Methylmalonic Acid, Quantitative: 114 nmol/L (ref 0–378)

## 2023-09-22 NOTE — Telephone Encounter (Signed)
 Left message for pt with lab results and recommendations

## 2023-09-22 NOTE — Telephone Encounter (Signed)
-----   Message from Lowanda Ruddy sent at 09/22/2023 11:22 AM EDT ----- Please let her know no concerning findings other than expected from spherocytosis. Ok to follow up yearly. Thanks.  ----- Message ----- From: Dannis Dy, Lab In [place] Sent: 09/19/2023  10:54 AM EDT To: Lowanda Ruddy, MD

## 2023-10-03 DIAGNOSIS — M9905 Segmental and somatic dysfunction of pelvic region: Secondary | ICD-10-CM | POA: Diagnosis not present

## 2023-10-03 DIAGNOSIS — M9903 Segmental and somatic dysfunction of lumbar region: Secondary | ICD-10-CM | POA: Diagnosis not present

## 2023-10-03 DIAGNOSIS — M9902 Segmental and somatic dysfunction of thoracic region: Secondary | ICD-10-CM | POA: Diagnosis not present

## 2023-10-03 DIAGNOSIS — M9901 Segmental and somatic dysfunction of cervical region: Secondary | ICD-10-CM | POA: Diagnosis not present

## 2023-10-03 DIAGNOSIS — M9906 Segmental and somatic dysfunction of lower extremity: Secondary | ICD-10-CM | POA: Diagnosis not present

## 2023-10-03 DIAGNOSIS — M79675 Pain in left toe(s): Secondary | ICD-10-CM | POA: Diagnosis not present

## 2023-10-31 DIAGNOSIS — M9906 Segmental and somatic dysfunction of lower extremity: Secondary | ICD-10-CM | POA: Diagnosis not present

## 2023-10-31 DIAGNOSIS — M79675 Pain in left toe(s): Secondary | ICD-10-CM | POA: Diagnosis not present

## 2023-10-31 DIAGNOSIS — M9902 Segmental and somatic dysfunction of thoracic region: Secondary | ICD-10-CM | POA: Diagnosis not present

## 2023-10-31 DIAGNOSIS — M9905 Segmental and somatic dysfunction of pelvic region: Secondary | ICD-10-CM | POA: Diagnosis not present

## 2023-10-31 DIAGNOSIS — M9903 Segmental and somatic dysfunction of lumbar region: Secondary | ICD-10-CM | POA: Diagnosis not present

## 2023-10-31 DIAGNOSIS — M9901 Segmental and somatic dysfunction of cervical region: Secondary | ICD-10-CM | POA: Diagnosis not present

## 2023-11-11 DIAGNOSIS — N898 Other specified noninflammatory disorders of vagina: Secondary | ICD-10-CM | POA: Diagnosis not present

## 2023-11-11 DIAGNOSIS — Z113 Encounter for screening for infections with a predominantly sexual mode of transmission: Secondary | ICD-10-CM | POA: Diagnosis not present

## 2023-11-11 DIAGNOSIS — Z01419 Encounter for gynecological examination (general) (routine) without abnormal findings: Secondary | ICD-10-CM | POA: Diagnosis not present

## 2023-11-25 DIAGNOSIS — M9906 Segmental and somatic dysfunction of lower extremity: Secondary | ICD-10-CM | POA: Diagnosis not present

## 2023-11-25 DIAGNOSIS — M9903 Segmental and somatic dysfunction of lumbar region: Secondary | ICD-10-CM | POA: Diagnosis not present

## 2023-11-25 DIAGNOSIS — M9905 Segmental and somatic dysfunction of pelvic region: Secondary | ICD-10-CM | POA: Diagnosis not present

## 2023-11-25 DIAGNOSIS — M9902 Segmental and somatic dysfunction of thoracic region: Secondary | ICD-10-CM | POA: Diagnosis not present

## 2023-11-25 DIAGNOSIS — M9901 Segmental and somatic dysfunction of cervical region: Secondary | ICD-10-CM | POA: Diagnosis not present

## 2023-11-25 DIAGNOSIS — M79675 Pain in left toe(s): Secondary | ICD-10-CM | POA: Diagnosis not present

## 2023-12-23 DIAGNOSIS — M9906 Segmental and somatic dysfunction of lower extremity: Secondary | ICD-10-CM | POA: Diagnosis not present

## 2023-12-23 DIAGNOSIS — M9902 Segmental and somatic dysfunction of thoracic region: Secondary | ICD-10-CM | POA: Diagnosis not present

## 2023-12-23 DIAGNOSIS — M9903 Segmental and somatic dysfunction of lumbar region: Secondary | ICD-10-CM | POA: Diagnosis not present

## 2023-12-23 DIAGNOSIS — M9901 Segmental and somatic dysfunction of cervical region: Secondary | ICD-10-CM | POA: Diagnosis not present

## 2023-12-23 DIAGNOSIS — M9905 Segmental and somatic dysfunction of pelvic region: Secondary | ICD-10-CM | POA: Diagnosis not present

## 2023-12-23 DIAGNOSIS — M79675 Pain in left toe(s): Secondary | ICD-10-CM | POA: Diagnosis not present

## 2024-01-20 DIAGNOSIS — M79675 Pain in left toe(s): Secondary | ICD-10-CM | POA: Diagnosis not present

## 2024-01-20 DIAGNOSIS — M9902 Segmental and somatic dysfunction of thoracic region: Secondary | ICD-10-CM | POA: Diagnosis not present

## 2024-01-20 DIAGNOSIS — M9906 Segmental and somatic dysfunction of lower extremity: Secondary | ICD-10-CM | POA: Diagnosis not present

## 2024-01-20 DIAGNOSIS — M9903 Segmental and somatic dysfunction of lumbar region: Secondary | ICD-10-CM | POA: Diagnosis not present

## 2024-01-20 DIAGNOSIS — M9901 Segmental and somatic dysfunction of cervical region: Secondary | ICD-10-CM | POA: Diagnosis not present

## 2024-01-20 DIAGNOSIS — M9905 Segmental and somatic dysfunction of pelvic region: Secondary | ICD-10-CM | POA: Diagnosis not present

## 2024-02-05 ENCOUNTER — Ambulatory Visit (INDEPENDENT_AMBULATORY_CARE_PROVIDER_SITE_OTHER)

## 2024-02-05 ENCOUNTER — Encounter: Payer: Self-pay | Admitting: Podiatry

## 2024-02-05 ENCOUNTER — Ambulatory Visit (INDEPENDENT_AMBULATORY_CARE_PROVIDER_SITE_OTHER): Admitting: Podiatry

## 2024-02-05 DIAGNOSIS — M949 Disorder of cartilage, unspecified: Secondary | ICD-10-CM | POA: Diagnosis not present

## 2024-02-05 DIAGNOSIS — M899 Disorder of bone, unspecified: Secondary | ICD-10-CM | POA: Diagnosis not present

## 2024-02-05 DIAGNOSIS — M7752 Other enthesopathy of left foot: Secondary | ICD-10-CM

## 2024-02-05 MED ORDER — METHYLPREDNISOLONE 4 MG PO TBPK: ORAL_TABLET | ORAL | 0 refills | Status: AC

## 2024-02-05 MED ORDER — NAPROXEN 500 MG PO TABS: 500.0000 mg | ORAL_TABLET | Freq: Two times a day (BID) | ORAL | 1 refills | Status: AC

## 2024-02-05 NOTE — Progress Notes (Signed)
 Subjective: Chief Complaint  Patient presents with   Foot Pain    Patient is here for left ankle pain has been having dull constant paiin for last 8 days   23 year old female presents the office with above concerns.  She said that she was doing well and she would have only sporadic intermittent short-term discomfort at times after being on her feet all day but since last Friday she has had ongoing more chronic discomfort point to the medial asp of the ankle just posterior to the medial malleolus.  She does not recall any injuries or change in activities.  She does not exercise regularly.  Objective: AAO x3, NAD DP/PT pulses palpable bilaterally, CRT less than 3 seconds Tenderness is mostly along the course the posterior tibial, flexor tendons just posterior to the medial malleolus.  There are some slight discomfort in the anterior medial asp of the ankle joint.  Ankle joint range of motion intact without any crepitation or restriction. No pain with calf compression, swelling, warmth, erythema  Assessment: Tendinitis left ankle, osteochondral lesion  Plan: -All treatment options discussed with the patient including all alternatives, risks, complications.  -X-rays obtained and reviewed.  Multiple views obtained.  Hardware intact but uncomplicated factors.  Osteochondral lesion noted along the medial gutter. -Open this is just an acute flare.  Prescribed Medrol Dosepak.  Once complete she can start naproxen.  Discussed for the next week using the ankle brace, icing.  If symptoms persist we discussed getting advanced imaging. -Patient encouraged to call the office with any questions, concerns, change in symptoms.   Return in about 4 weeks (around 03/04/2024), or if symptoms worsen or fail to improve.  Frances Bowman DPM

## 2024-02-05 NOTE — Patient Instructions (Signed)
 Start with the medrol dose pack. Once complete you can use the naproxen. Don't take together.

## 2024-02-17 DIAGNOSIS — M79675 Pain in left toe(s): Secondary | ICD-10-CM | POA: Diagnosis not present

## 2024-02-17 DIAGNOSIS — M9906 Segmental and somatic dysfunction of lower extremity: Secondary | ICD-10-CM | POA: Diagnosis not present

## 2024-02-17 DIAGNOSIS — M9902 Segmental and somatic dysfunction of thoracic region: Secondary | ICD-10-CM | POA: Diagnosis not present

## 2024-02-17 DIAGNOSIS — M9901 Segmental and somatic dysfunction of cervical region: Secondary | ICD-10-CM | POA: Diagnosis not present

## 2024-03-09 ENCOUNTER — Ambulatory Visit: Admitting: Podiatry

## 2024-03-09 DIAGNOSIS — M7752 Other enthesopathy of left foot: Secondary | ICD-10-CM | POA: Diagnosis not present

## 2024-03-09 DIAGNOSIS — M949 Disorder of cartilage, unspecified: Secondary | ICD-10-CM | POA: Diagnosis not present

## 2024-03-09 DIAGNOSIS — M899 Disorder of bone, unspecified: Secondary | ICD-10-CM

## 2024-03-10 NOTE — Progress Notes (Signed)
 Subjective: No chief complaint on file.    23 year old female presents the office with above concerns.  She states that she is doing much better.  She completed the steroids and it did help.  Does not have any significant pain at this time no swelling.  Occasional discomfort with certain activities but otherwise doing much better and no other concerns today.  Objective: AAO x3, NAD DP/PT pulses palpable bilaterally, CRT less than 3 seconds Today not able to appreciate any areas of pinpoint tenderness.  Is also no tenderness in the course of the posterior tibial and flexor tendons.  Ankle range of motion intact with any restrictions or crepitation.  There is no area pinpoint tenderness.  No edema, erythema. No pain with calf compression, swelling, warmth, erythema  Assessment: Tendinitis left ankle, osteochondral lesion  Plan: -All treatment options discussed with the patient including all alternatives, risks, complications.  -Overall symptoms have much improved.  Discussed different options including getting advanced imaging.  Ultimately we decided to hold off on this as she is not having any pain at this time.  Continue with supportive shoe gear.  Discussed wearing ankle brace if needed especially if she is on uneven surfaces.  Will continue to monitor any changes were to occur to let us  know.  Return if symptoms worsen or fail to improve.  Frances Bowman DPM

## 2024-03-22 IMAGING — CT CT ANKLE*L* W/O CM
3 series · 12 of 33 positions shown, 14 images · non-contrast
Comparison: X-ray 08/16/2021, MRI 08/06/2017

CLINICAL DATA: Medial left ankle pain for 1 month. Osteochondral
defect

EXAM:
CT OF THE LEFT ANKLE WITHOUT CONTRAST
TECHNIQUE: Multidetector CT imaging of the left ankle was performed according
to the standard protocol. Multiplanar CT image reconstructions were
also generated.
RADIATION DOSE REDUCTION: This exam was performed according to the
departmental dose-optimization program which includes automated
exposure control, adjustment of the mA and/or kV according to
patient size and/or use of iterative reconstruction technique.

[Series 5: lt ankle 2.00 br40 s3 soft (person_name) · axial · 0.19mm/px · z∈[+560,+682]mm · 4 of 89 slices shown, 5 images (1 of 3)]
[im 14/89  soft-tissue]
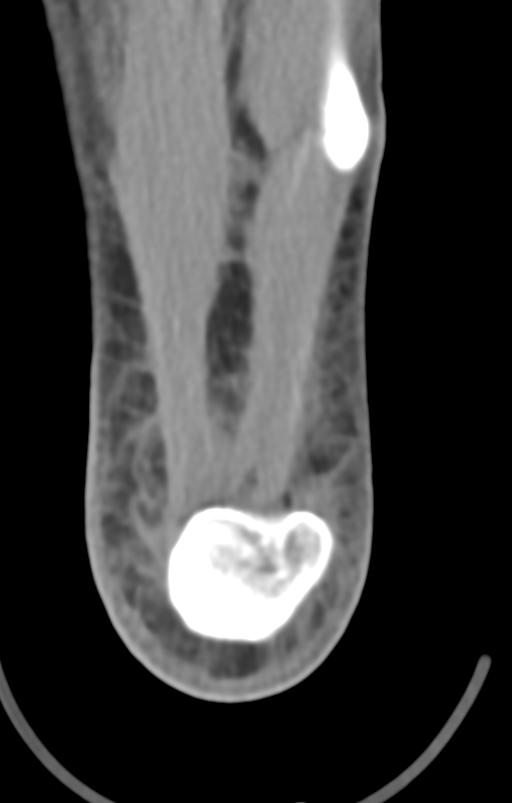
[im 14/89  bone]
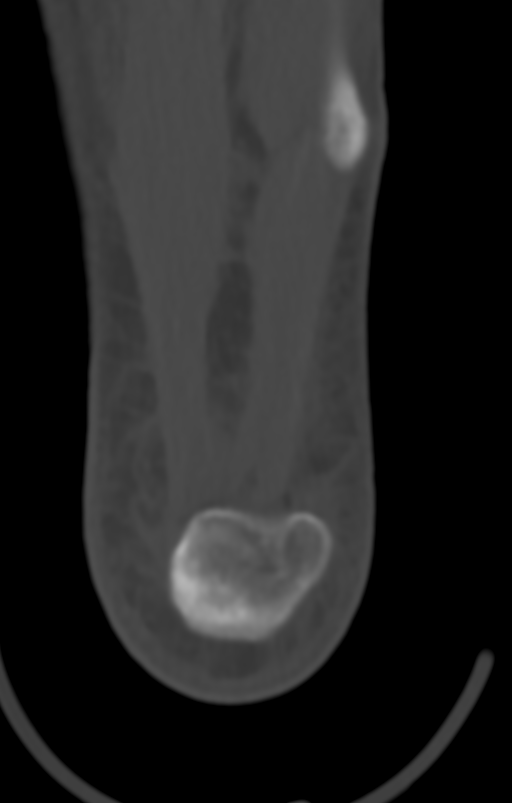
[im 34/89  bone]
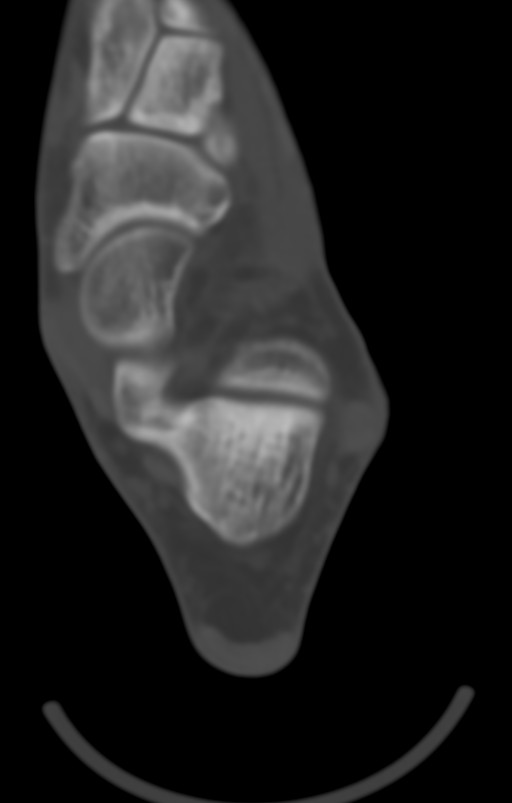
[im 55/89  bone]
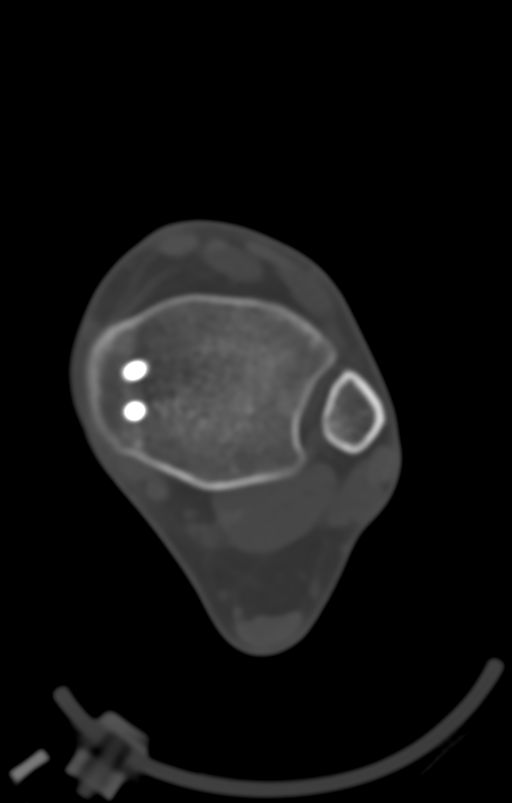
[im 75/89  bone]
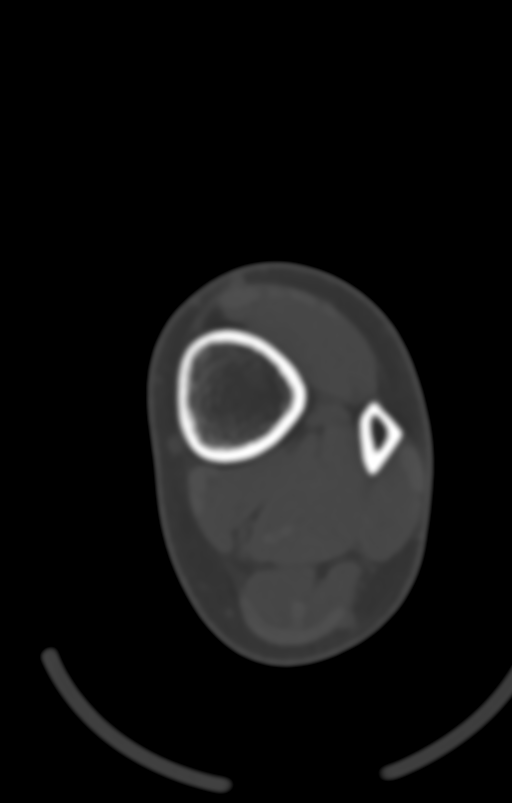

[Series 9: lt ankle 2.00 br40 s3 soft (person_name) · coronal · 0.24mm/px · 3 of 74 slices shown (2 of 3)]
[im 15/74  bone]
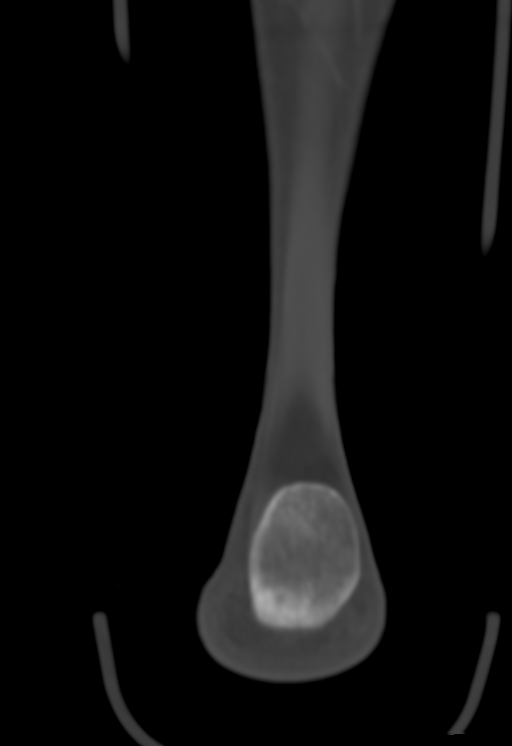
[im 30/74  bone]
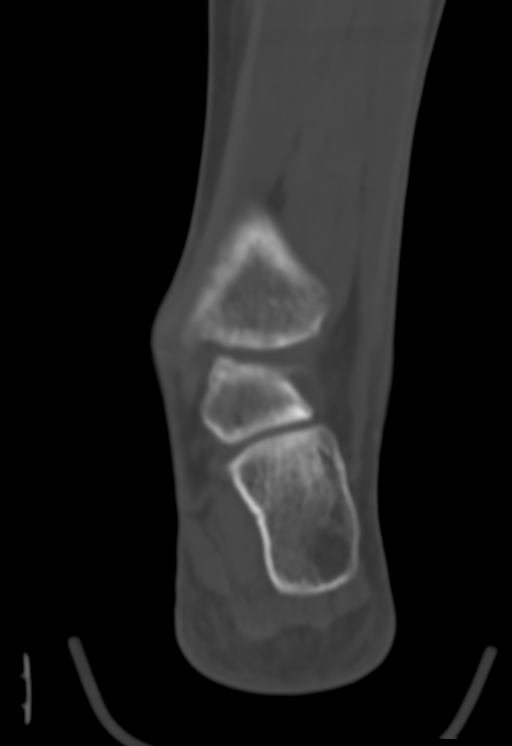
[im 44/74  bone]
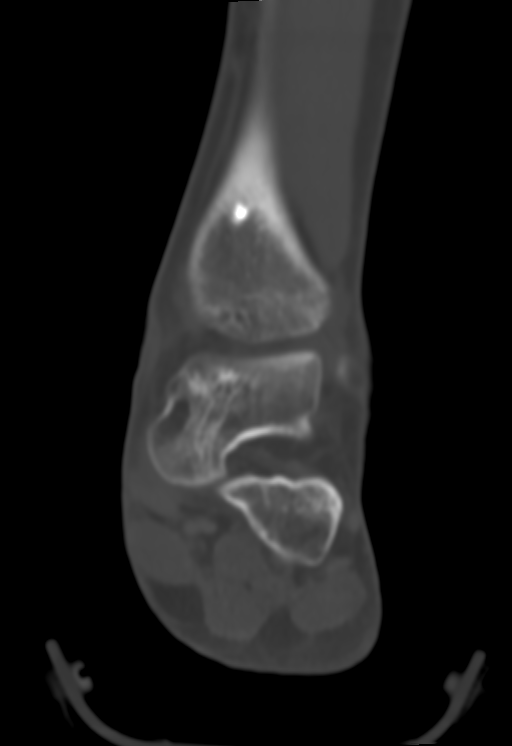

[Series 13: lt ankle 2.00 br40 s3 soft (person_name) · sagittal · 0.29mm/px · 5 of 62 slices shown, 6 images (3 of 3)]
[im 21/62  bone]
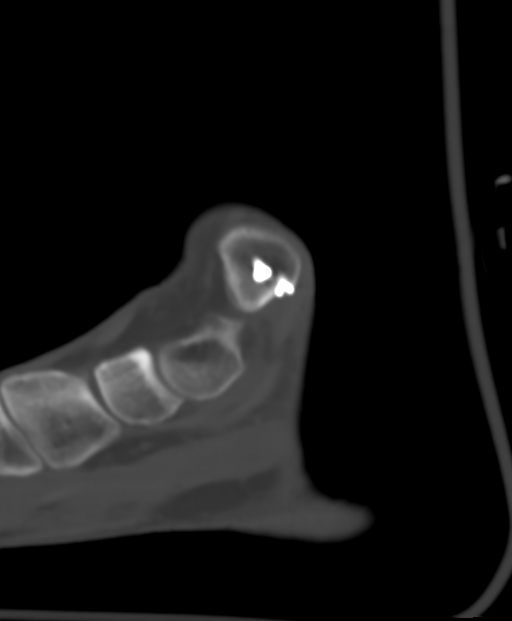
[im 26/62  bone]
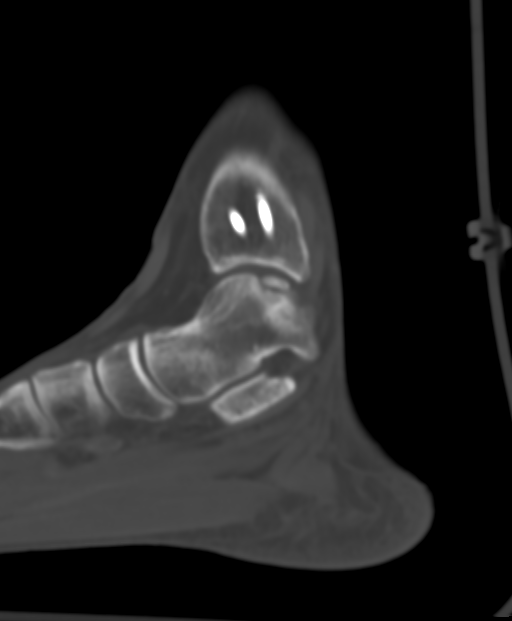
[im 31/62  soft-tissue]
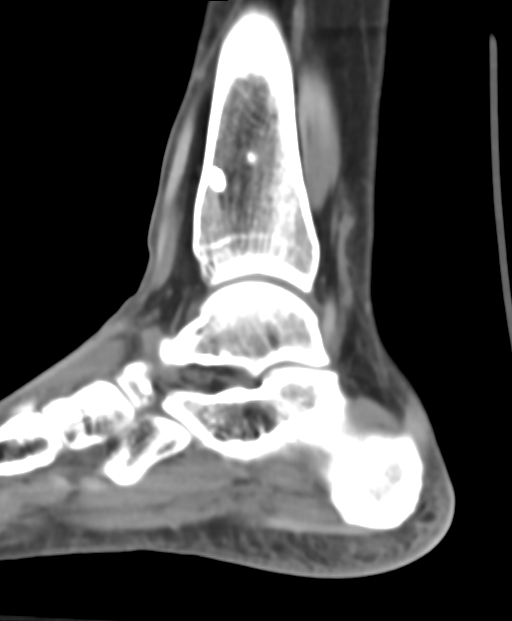
[im 31/62  bone]
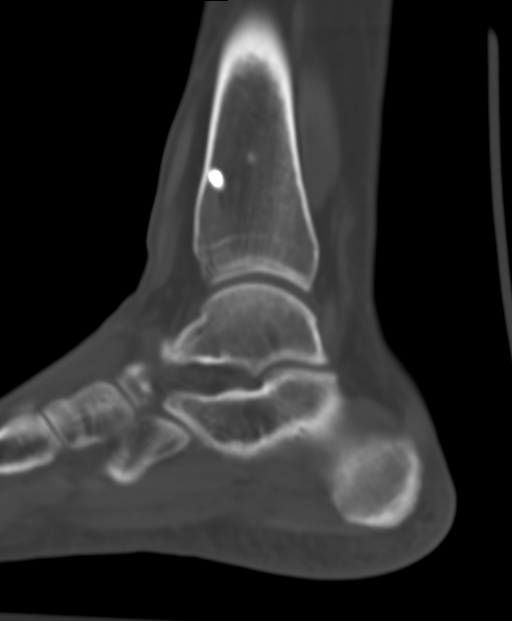
[im 36/62  bone]
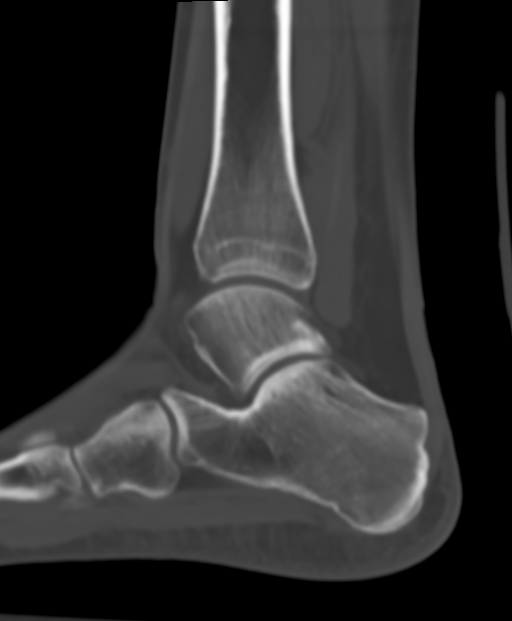
[im 41/62  bone]
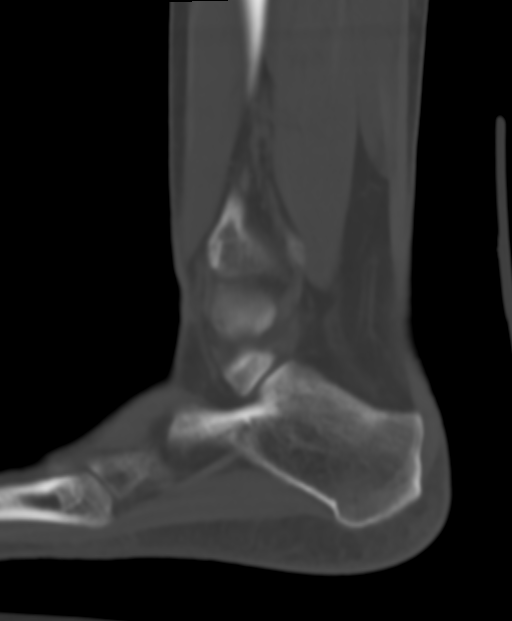

[12 of 33 positions shown; findings below may reference images not displayed]

FINDINGS: Bones/Joint/Cartilage

Chronic appearing fragmented osteochondral defect of the medial
talar shoulder measuring 2.0 x 0.5 x 0.8 cm. Nondisplaced free
fragment measures 0.8 x 0.3 x 0.3 cm. Tibiotalar joint space is
maintained. Normal ankle joint alignment. No appreciable joint
effusion.

No acute fracture. No dislocation. Prior medial malleolar ORIF with
2 intact partially threaded cannulated screws. No residual fracture
lucency is evident. Osseous structures appear otherwise intact and
unremarkable.

Ligaments

Suboptimally assessed by CT.

Muscles and Tendons

Musculotendinous structures appear within normal limits by CT.

Soft tissues

No soft tissue swelling or fluid collection.
IMPRESSION: 1. Chronic fragmented osteochondral defect of the medial talar
shoulder measuring up to 2.0 cm.
2. Prior medial malleolar ORIF without evidence of hardware
complication.

## 2024-03-24 DIAGNOSIS — M9901 Segmental and somatic dysfunction of cervical region: Secondary | ICD-10-CM | POA: Diagnosis not present

## 2024-03-24 DIAGNOSIS — M9902 Segmental and somatic dysfunction of thoracic region: Secondary | ICD-10-CM | POA: Diagnosis not present

## 2024-03-24 DIAGNOSIS — M79675 Pain in left toe(s): Secondary | ICD-10-CM | POA: Diagnosis not present

## 2024-03-24 DIAGNOSIS — M9906 Segmental and somatic dysfunction of lower extremity: Secondary | ICD-10-CM | POA: Diagnosis not present

## 2024-09-21 ENCOUNTER — Ambulatory Visit

## 2024-09-21 ENCOUNTER — Other Ambulatory Visit

## 8762-09-07 DEATH — deceased
